# Patient Record
Sex: Female | Born: 1960 | Race: White | Hispanic: No | Marital: Married | Smoking: Never smoker
Health system: Southern US, Community
[De-identification: ages and names within clinical notes are randomized; demographics above are authoritative.]

## PROBLEM LIST (undated history)

## (undated) DIAGNOSIS — T7840XA Allergy, unspecified, initial encounter: Secondary | ICD-10-CM

## (undated) DIAGNOSIS — R011 Cardiac murmur, unspecified: Secondary | ICD-10-CM

## (undated) DIAGNOSIS — E739 Lactose intolerance, unspecified: Secondary | ICD-10-CM

## (undated) DIAGNOSIS — K219 Gastro-esophageal reflux disease without esophagitis: Secondary | ICD-10-CM

## (undated) DIAGNOSIS — K589 Irritable bowel syndrome without diarrhea: Secondary | ICD-10-CM

## (undated) DIAGNOSIS — E538 Deficiency of other specified B group vitamins: Secondary | ICD-10-CM

## (undated) DIAGNOSIS — E88819 Insulin resistance, unspecified: Secondary | ICD-10-CM

## (undated) DIAGNOSIS — E669 Obesity, unspecified: Secondary | ICD-10-CM

## (undated) DIAGNOSIS — I493 Ventricular premature depolarization: Secondary | ICD-10-CM

## (undated) DIAGNOSIS — H269 Unspecified cataract: Secondary | ICD-10-CM

## (undated) DIAGNOSIS — Z5189 Encounter for other specified aftercare: Secondary | ICD-10-CM

## (undated) DIAGNOSIS — M1990 Unspecified osteoarthritis, unspecified site: Secondary | ICD-10-CM

## (undated) DIAGNOSIS — E119 Type 2 diabetes mellitus without complications: Secondary | ICD-10-CM

## (undated) DIAGNOSIS — I10 Essential (primary) hypertension: Secondary | ICD-10-CM

## (undated) DIAGNOSIS — M549 Dorsalgia, unspecified: Secondary | ICD-10-CM

## (undated) DIAGNOSIS — R0602 Shortness of breath: Secondary | ICD-10-CM

## (undated) DIAGNOSIS — D689 Coagulation defect, unspecified: Secondary | ICD-10-CM

## (undated) HISTORY — DX: Shortness of breath: R06.02

## (undated) HISTORY — DX: Type 2 diabetes mellitus without complications: E11.9

## (undated) HISTORY — DX: Coagulation defect, unspecified: D68.9

## (undated) HISTORY — DX: Deficiency of other specified B group vitamins: E53.8

## (undated) HISTORY — DX: Allergy, unspecified, initial encounter: T78.40XA

## (undated) HISTORY — DX: Insulin resistance, unspecified: E88.819

## (undated) HISTORY — DX: Ventricular premature depolarization: I49.3

## (undated) HISTORY — DX: Unspecified cataract: H26.9

## (undated) HISTORY — DX: Gastro-esophageal reflux disease without esophagitis: K21.9

## (undated) HISTORY — DX: Irritable bowel syndrome, unspecified: K58.9

## (undated) HISTORY — DX: Dorsalgia, unspecified: M54.9

## (undated) HISTORY — DX: Cardiac murmur, unspecified: R01.1

## (undated) HISTORY — DX: Encounter for other specified aftercare: Z51.89

## (undated) HISTORY — DX: Unspecified osteoarthritis, unspecified site: M19.90

## (undated) HISTORY — PX: TONSILECTOMY/ADENOIDECTOMY WITH MYRINGOTOMY: SHX6125

## (undated) HISTORY — DX: Lactose intolerance, unspecified: E73.9

## (undated) HISTORY — DX: Obesity, unspecified: E66.9

---
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
## 1980-09-11 HISTORY — PX: APPENDECTOMY: SHX54

## 2008-09-11 DIAGNOSIS — I10 Essential (primary) hypertension: Secondary | ICD-10-CM | POA: Insufficient documentation

## 2008-09-11 DIAGNOSIS — D5919 Other autoimmune hemolytic anemia: Secondary | ICD-10-CM

## 2008-09-11 DIAGNOSIS — Z8639 Personal history of other endocrine, nutritional and metabolic disease: Secondary | ICD-10-CM | POA: Insufficient documentation

## 2008-09-11 HISTORY — DX: Other autoimmune hemolytic anemia: D59.19

## 2008-09-11 HISTORY — DX: Essential (primary) hypertension: I10

## 2010-09-11 HISTORY — PX: OTHER SURGICAL HISTORY: SHX169

## 2010-09-11 HISTORY — PX: TOTAL KNEE ARTHROPLASTY: SHX125

## 2012-05-12 DIAGNOSIS — K589 Irritable bowel syndrome without diarrhea: Secondary | ICD-10-CM

## 2012-05-12 HISTORY — PX: SPLENECTOMY: SUR1306

## 2012-05-12 HISTORY — DX: Irritable bowel syndrome, unspecified: K58.9

## 2017-06-26 DIAGNOSIS — Z9081 Acquired absence of spleen: Secondary | ICD-10-CM | POA: Insufficient documentation

## 2018-04-22 DIAGNOSIS — Z8639 Personal history of other endocrine, nutritional and metabolic disease: Secondary | ICD-10-CM | POA: Insufficient documentation

## 2019-03-25 ENCOUNTER — Encounter: Payer: Self-pay | Admitting: Physician Assistant

## 2019-03-25 ENCOUNTER — Ambulatory Visit (INDEPENDENT_AMBULATORY_CARE_PROVIDER_SITE_OTHER): Payer: Medicare Other | Admitting: Physician Assistant

## 2019-03-25 VITALS — BP 138/84 | Temp 97.3°F | Ht 69.0 in | Wt 299.0 lb

## 2019-03-25 DIAGNOSIS — K589 Irritable bowel syndrome without diarrhea: Secondary | ICD-10-CM

## 2019-03-25 DIAGNOSIS — I10 Essential (primary) hypertension: Secondary | ICD-10-CM | POA: Diagnosis not present

## 2019-03-25 DIAGNOSIS — D591 Other autoimmune hemolytic anemias: Secondary | ICD-10-CM | POA: Diagnosis not present

## 2019-03-25 DIAGNOSIS — D5919 Other autoimmune hemolytic anemia: Secondary | ICD-10-CM

## 2019-03-25 NOTE — Progress Notes (Signed)
Virtual Visit via Video   I connected with Connie Mosley on 03/25/19 at  8:20 AM EDT by a video enabled telemedicine application and verified that I am speaking with the correct person using two identifiers. Location patient: Home Location provider: [facility], Office Persons participating in the virtual visit: Connie Mosley, Jarold MottoSamantha Rula [place] PA-C   I discussed the limitations of evaluation and management by telemedicine and the availability of in person appointments. The patient expressed understanding and agreed to proceed.  Subjective:   HPI:   Idiopathic autoimmune hemolytic anemia -- diagnosed around 10 years ago. Hgb has been as low as in the 4's. Multiple transfusions. Treatment in past include: prednisone, chemo, splenectomy  HTN -- Currently taking Lisinopril 20 mg. At home blood pressure readings are: 130/78. Patient denies chest pain, SOB, blurred vision, dizziness, unusual headaches, lower leg swelling. Patient is compliant with medication. Denies excessive caffeine intake, stimulant usage, excessive alcohol intake, or increase in salt consumption.  IBS -- variable control; takes pepcid 40 mg daily, denies any urgent concerns at this time  ROS: See pertinent positives and negatives per HPI.  Patient Active Problem List   Diagnosis Date Noted  . History of non anemic vitamin B12 deficiency 04/22/2018  . Morbid obesity due to excess calories (HCC) 06/26/2017  . 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 Irritable bowel syndrome 05/12/2012  . Hypertension 09/11/2008  . Idiopathic autoimmune hemolytic anemia (HCC) 2010  . History of diet-controlled diabetes 2010    Social History   Tobacco Use  . Smoking status: Never Smoker  . Smokeless tobacco: Never Used  Substance Use Topics  . Alcohol use: Yes    Current Outpatient Medications:  .  cyanocobalamin (,VITAMIN B-12,) 1000 MCG/ML injection, Inject into the muscle., Disp: , Rfl:  .  famotidine (PEPCID) 40 MG tablet, Take 40  mg by mouth daily., Disp: , Rfl:  .  folic acid (FOLVITE) 1 MG tablet, Take by mouth., Disp: , Rfl:  .  lisinopril (ZESTRIL) 20 MG tablet, Take by mouth., Disp: , Rfl:   Allergies  Allergen Reactions  . Cefuroxime Axetil Itching  . Sulfamethoxazole-Trimethoprim Other (See Comments)    Reacts with Bone Marrow per Hemotologist   . Cefuroxime Hives and Rash    Objective:   VITALS: Per patient if applicable, see vitals. GENERAL: Alert, appears well and in no acute distress. HEENT: Atraumatic, conjunctiva clear, no obvious abnormalities on inspection of external nose and ears. NECK: Normal movements of the head and neck. CARDIOPULMONARY: No increased WOB. Speaking in clear sentences. I:E ratio WNL.  MS: Moves all visible extremities without noticeable abnormality. PSYCH: Pleasant and cooperative, well-groomed. Speech normal rate and rhythm. Affect is appropriate. Insight and judgement are appropriate. Attention is focused, linear, and appropriate.  NEURO: CN grossly intact. Oriented as arrived to appointment on time with no prompting. Moves both UE equally.  SKIN: No obvious lesions, wounds, erythema, or cyanosis noted on face or hands.  Assessment and Plan:   Taiylor was seen today for establish care.  Diagnoses and all orders for this visit:  Hypertension, unspecified type Controlled. Continue Lisinopril 20 mg daily. Follow-up in 1 year, sooner if issues.  Irritable bowel syndrome, unspecified type Controlled overall.  Idiopathic autoimmune hemolytic anemia (HCC) Management per Dr. Donia Guilesyan [place]. UTD on all vaxx.   . Reviewed expectations re: course of current medical issues. . Discussed self-management of symptoms. . Outlined signs and symptoms indicating need for more acute intervention. . Patient verbalized understanding and all questions  were answered. Marland Kitchen Health Maintenance issues including appropriate healthy diet, exercise, and smoking avoidance were discussed with patient.  . See orders for this visit as documented in the electronic medical record.  I discussed the assessment and treatment plan with the patient. The patient was provided an opportunity to ask questions and all were answered. The patient agreed with the plan and demonstrated an understanding of the instructions.   The patient was advised to call back or seek an in-person evaluation if the symptoms worsen or if the condition fails to improve as anticipated.    [place], [place] 03/25/2019

## 2019-03-25 NOTE — Patient Instructions (Addendum)
Health Maintenance Due  Topic Date Due  . Hepatitis C Screening  11-12-1960  . URINE MICROALBUMIN  10/11/1968  . HIV Screening  10/11/1973  . TETANUS/TDAP  10/11/1977  . PAP SMEAR-Modifier 2018 10/12/1979  . MAMMOGRAM 2018 10/11/2008  . COLONOSCOPY 2014 10/11/2008  Will discuss during visit.  Depression screen [facility] 2/9 03/25/2019  Decreased Interest 0  Down, Depressed, Hopeless 0  PHQ - 2 Score 0

## 2019-05-12 ENCOUNTER — Other Ambulatory Visit: Payer: Self-pay | Admitting: Physician Assistant

## 2019-05-12 MED ORDER — FAMOTIDINE 40 MG PO TABS: 40.0000 mg | ORAL_TABLET | Freq: Every day | ORAL | 1 refills | Status: DC

## 2019-05-12 NOTE — Telephone Encounter (Signed)
Medication Refill - Medication: famotidine (PEPCID) 40 MG tablet  Has the patient contacted their pharmacy? Yes - this will be the first time that PCP has filled. (Agent: If no, request that the patient contact the pharmacy for the refill.) (Agent: If yes, when and what did the pharmacy advise?)  Preferred Pharmacy (with phone number or street name):  [id number] - [place], [place] - [place] [phone] (Phone) [phone] (Fax)   Agent: Please be advised that RX refills may take up to 3 business days. We ask that you follow-up with your pharmacy.

## 2019-05-12 NOTE — Telephone Encounter (Signed)
See note

## 2019-05-12 NOTE — Telephone Encounter (Signed)
Requested medication (s) are due for refill today: yes  Requested medication (s) are on the active medication list: yes Last refill:  03/2019  Future visit scheduled: no  Notes to clinic: review for refill   Requested Prescriptions  Pending Prescriptions Disp Refills   famotidine (PEPCID) 40 MG tablet       Sig: Take 1 tablet (40 mg total) by mouth daily.     Gastroenterology:  H2 Antagonists Passed - 05/12/2019 11:07 AM      Passed - Valid encounter within last 12 months    Recent Outpatient Visits          1 month ago Hypertension, unspecified type   Leland Riggston, [place], [place]

## 2019-07-17 ENCOUNTER — Other Ambulatory Visit: Payer: Self-pay | Admitting: Physician Assistant

## 2019-07-17 MED ORDER — LISINOPRIL 20 MG PO TABS: 20.0000 mg | ORAL_TABLET | Freq: Every day | ORAL | 1 refills | Status: DC

## 2019-07-17 NOTE — Telephone Encounter (Signed)
rx refill lisinopril (ZESTRIL) 20 MG tablet  PHARMACY [id number] - [place], [place] - [place]

## 2019-07-17 NOTE — Telephone Encounter (Signed)
Rx sent to pharmacy   

## 2019-07-17 NOTE — Telephone Encounter (Signed)
See request °

## 2019-10-31 ENCOUNTER — Other Ambulatory Visit: Payer: Self-pay | Admitting: Physician Assistant

## 2019-12-22 ENCOUNTER — Telehealth: Payer: Self-pay | Admitting: Physician Assistant

## 2019-12-22 NOTE — Telephone Encounter (Signed)
I spoke with the patient and scheduled her AWV with Toni Amend and CPE with Lelon Mast.  Could someone please contact the patient with information on how and when to complete labs if needed?  Thanks.

## 2019-12-23 NOTE — Telephone Encounter (Signed)
Left detailed message on personal voicemail when you come in for your physical on April 26th with Lelon Mast you will have labs that day. Nothing to eat after midnight may have water, black coffee and unsweet tea. Any questions please call the office.

## 2020-01-05 ENCOUNTER — Ambulatory Visit (INDEPENDENT_AMBULATORY_CARE_PROVIDER_SITE_OTHER): Payer: Medicare Other | Admitting: Physician Assistant

## 2020-01-05 ENCOUNTER — Encounter: Payer: Self-pay | Admitting: Physician Assistant

## 2020-01-05 ENCOUNTER — Other Ambulatory Visit: Payer: Self-pay

## 2020-01-05 ENCOUNTER — Ambulatory Visit (INDEPENDENT_AMBULATORY_CARE_PROVIDER_SITE_OTHER): Payer: Medicare Other

## 2020-01-05 VITALS — BP 128/76 | HR 64 | Temp 98.8°F | Ht 69.0 in | Wt 323.0 lb

## 2020-01-05 DIAGNOSIS — Z13220 Encounter for screening for lipoid disorders: Secondary | ICD-10-CM

## 2020-01-05 DIAGNOSIS — I493 Ventricular premature depolarization: Secondary | ICD-10-CM | POA: Diagnosis not present

## 2020-01-05 DIAGNOSIS — E8881 Metabolic syndrome: Secondary | ICD-10-CM | POA: Diagnosis not present

## 2020-01-05 DIAGNOSIS — Z136 Encounter for screening for cardiovascular disorders: Secondary | ICD-10-CM | POA: Diagnosis not present

## 2020-01-05 DIAGNOSIS — K589 Irritable bowel syndrome without diarrhea: Secondary | ICD-10-CM

## 2020-01-05 DIAGNOSIS — Z0000 Encounter for general adult medical examination without abnormal findings: Secondary | ICD-10-CM

## 2020-01-05 DIAGNOSIS — R079 Chest pain, unspecified: Secondary | ICD-10-CM

## 2020-01-05 LAB — LIPID PANEL
Cholesterol: 172 mg/dL (ref 0–200)
HDL: 51.6 mg/dL (ref >39.00)
LDL Cholesterol: 102 mg/dL — ABNORMAL HIGH (ref 0–99)
NonHDL: 120.81
Total CHOL/HDL Ratio: 3
Triglycerides: 94 mg/dL (ref 0.0–149.0)
VLDL: 18.8 mg/dL (ref 0.0–40.0)

## 2020-01-05 LAB — HEMOGLOBIN A1C: Hgb A1c MFr Bld: 6.1 % (ref 4.6–6.5)

## 2020-01-05 LAB — CBC WITH DIFFERENTIAL/PLATELET
Basophils Absolute: 0.1 10*3/uL (ref 0.0–0.1)
Basophils Relative: 1.3 % (ref 0.0–3.0)
Eosinophils Absolute: 0.3 10*3/uL (ref 0.0–0.7)
Eosinophils Relative: 3.1 % (ref 0.0–5.0)
HCT: 45.6 % (ref 36.0–46.0)
Hemoglobin: 15 g/dL (ref 12.0–15.0)
Lymphocytes Relative: 51.2 % — ABNORMAL HIGH (ref 12.0–46.0)
Lymphs Abs: 4.9 10*3/uL — ABNORMAL HIGH (ref 0.7–4.0)
MCHC: 32.9 g/dL (ref 30.0–36.0)
MCV: 88.3 fl (ref 78.0–100.0)
Monocytes Absolute: 0.8 10*3/uL (ref 0.1–1.0)
Monocytes Relative: 8.1 % (ref 3.0–12.0)
Neutro Abs: 3.5 10*3/uL (ref 1.4–7.7)
Neutrophils Relative %: 36.3 % — ABNORMAL LOW (ref 43.0–77.0)
Platelets: 258 10*3/uL (ref 150.0–400.0)
RBC: 5.16 Mil/uL — ABNORMAL HIGH (ref 3.87–5.11)
RDW: 16 % — ABNORMAL HIGH (ref 11.5–15.5)
WBC: 9.6 10*3/uL (ref 4.0–10.5)

## 2020-01-05 LAB — COMPREHENSIVE METABOLIC PANEL
ALT: 26 U/L (ref 0–35)
AST: 27 U/L (ref 0–37)
Albumin: 4.4 g/dL (ref 3.5–5.2)
Alkaline Phosphatase: 67 U/L (ref 39–117)
BUN: 9 mg/dL (ref 6–23)
CO2: 28 mEq/L (ref 19–32)
Calcium: 9.6 mg/dL (ref 8.4–10.5)
Chloride: 104 mEq/L (ref 96–112)
Creatinine, Ser: 0.79 mg/dL (ref 0.40–1.20)
GFR: 73.93 mL/min (ref >60.00)
Glucose, Bld: 128 mg/dL — ABNORMAL HIGH (ref 70–99)
Potassium: 5 mEq/L (ref 3.5–5.1)
Sodium: 140 mEq/L (ref 135–145)
Total Bilirubin: 1.1 mg/dL (ref 0.2–1.2)
Total Protein: 6.9 g/dL (ref 6.0–8.3)

## 2020-01-05 LAB — TSH: TSH: 0.85 u[IU]/mL (ref 0.35–4.50)

## 2020-01-05 NOTE — Patient Instructions (Addendum)
It was great to see you!  I will be in touch via Mychart regarding your lab results. If you develop any severe chest pain or shortness of breath, please go to the ER.  Cardiology will contact you for an appointment.  To-do list: 1. Cardiology 2. Mammogram 3. Get [place] your colonoscopy records  Due to recent changes in healthcare laws, you may see the results of your imaging and laboratory studies on MyChart before your provider has had a chance to review them.  We understand that in some cases there may be results that are confusing or concerning to you. Not all laboratory results come back in the same time frame and the provider may be waiting for multiple results in order to interpret others.  Please give [place] 48 hours in order for your provider to thoroughly review all the results before contacting the office for clarification of your results.   Take care,  Jarold Motto PA-C

## 2020-01-05 NOTE — Patient Instructions (Signed)
Connie Mosley , Thank you for taking time to come for your Medicare Wellness Visit. I appreciate your ongoing commitment to your health goals. Please review the following plan we discussed and let me know if I can assist you in the future.   Screening recommendations/referrals: Colorectal Screening:  please provide records if available  Mammogram: please consider  Bone Density: please provide records if available   Vision and Dental Exams: Recommended annual ophthalmology exams for early detection of glaucoma and other disorders of the eye Recommended annual dental exams for proper oral hygiene  Vaccinations: Influenza vaccine: completed  Pneumococcal vaccine: up to date  Tdap vaccine: up to date   Shingles vaccine:  You may receive this vaccine at your local pharmacy. (see handout)  Covid vaccine:  Completed   Advanced directives:  I have provided a copy for you to complete at home and have notarized. Once this is complete please bring a copy in to our office so we can scan it into your chart.  Goals: Recommend to drink at least 6-8 8oz glasses of water per day and consume a balanced diet rich in fresh fruits and vegetables.   Next appointment: Please schedule your Annual Wellness Visit with your Nurse Health Advisor in one year.  Preventive Care 40-64 Years, Female Preventive care refers to lifestyle choices and visits with your health care provider that can promote health and wellness. What does preventive care include?  A yearly physical exam. This is also called an annual well check.  Dental exams once or twice a year.  Routine eye exams. Ask your health care provider how often you should have your eyes checked.  Personal lifestyle choices, including:  Daily care of your teeth and gums.  Regular physical activity.  Eating a healthy diet.  Avoiding tobacco and drug use.  Limiting alcohol use.  Practicing safe sex.  Taking low-dose aspirin daily starting at age 87 if  recommended by your health care provider.  Taking vitamin and mineral supplements as recommended by your health care provider. What happens during an annual well check? The services and screenings done by your health care provider during your annual well check will depend on your age, overall health, lifestyle risk factors, and family history of disease. Counseling  Your health care provider may ask you questions about your:  Alcohol use.  Tobacco use.  Drug use.  Emotional well-being.  Home and relationship well-being.  Sexual activity.  Eating habits.  Work and work Statistician.  Method of birth control.  Menstrual cycle.  Pregnancy history. Screening  You may have the following tests or measurements:  Height, weight, and BMI.  Blood pressure.  Lipid and cholesterol levels. These may be checked every 5 years, or more frequently if you are over 71 years old.  Skin check.  Lung cancer screening. You may have this screening every year starting at age 60 if you have a 30-pack-year history of smoking and currently smoke or have quit within the past 15 years.  Fecal occult blood test (FOBT) of the stool. You may have this test every year starting at age 51.  Flexible sigmoidoscopy or colonoscopy. You may have a sigmoidoscopy every 5 years or a colonoscopy every 10 years starting at age 13.  Hepatitis C blood test.  Hepatitis B blood test.  Sexually transmitted disease (STD) testing.  Diabetes screening. This is done by checking your blood sugar (glucose) after you have not eaten for a while (fasting). You may have this done  every 1-3 years.  Mammogram. This may be done every 1-2 years. Talk to your health care provider about when you should start having regular mammograms. This may depend on whether you have a family history of breast cancer.  BRCA-related cancer screening. This may be done if you have a family history of breast, ovarian, tubal, or peritoneal  cancers.  Pelvic exam and Pap test. This may be done every 3 years starting at age 17. Starting at age 53, this may be done every 5 years if you have a Pap test in combination with an HPV test.  Bone density scan. This is done to screen for osteoporosis. You may have this scan if you are at high risk for osteoporosis. Discuss your test results, treatment options, and if necessary, the need for more tests with your health care provider. Vaccines  Your health care provider may recommend certain vaccines, such as:  Influenza vaccine. This is recommended every year.  Tetanus, diphtheria, and acellular pertussis (Tdap, Td) vaccine. You may need a Td booster every 10 years.  Zoster vaccine. You may need this after age 62.  Pneumococcal 13-valent conjugate (PCV13) vaccine. You may need this if you have certain conditions and were not previously vaccinated.  Pneumococcal polysaccharide (PPSV23) vaccine. You may need one or two doses if you smoke cigarettes or if you have certain conditions. Talk to your health care provider about which screenings and vaccines you need and how often you need them. This information is not intended to replace advice given to you by your health care provider. Make sure you discuss any questions you have with your health care provider. Document Released: 09/24/2015 Document Revised: 05/17/2016 Document Reviewed: 06/29/2015 Elsevier Interactive Patient Education  2017 [employer] Prevention in the Home Falls can cause injuries. They can happen to people of all ages. There are many things you can do to make your home safe and to help prevent falls. What can I do on the outside of my home?  Regularly fix the edges of walkways and driveways and fix any cracks.  Remove anything that might make you trip as you walk through a door, such as a raised step or threshold.  Trim any bushes or trees on the path to your home.  Use bright outdoor lighting.  Clear  any walking paths of anything that might make someone trip, such as rocks or tools.  Regularly check to see if handrails are loose or broken. Make sure that both sides of any steps have handrails.  Any raised decks and porches should have guardrails on the edges.  Have any leaves, snow, or ice cleared regularly.  Use sand or salt on walking paths during winter.  Clean up any spills in your garage right away. This includes oil or grease spills. What can I do in the bathroom?  Use night lights.  Install grab bars by the toilet and in the tub and shower. Do not use towel bars as grab bars.  Use non-skid mats or decals in the tub or shower.  If you need to sit down in the shower, use a plastic, non-slip stool.  Keep the floor dry. Clean up any water that spills on the floor as soon as it happens.  Remove soap buildup in the tub or shower regularly.  Attach bath mats securely with double-sided non-slip rug tape.  Do not have throw rugs and other things on the floor that can make you trip. What can I  do in the bedroom?  Use night lights.  Make sure that you have a light by your bed that is easy to reach.  Do not use any sheets or blankets that are too big for your bed. They should not hang down onto the floor.  Have a firm chair that has side arms. You can use this for support while you get dressed.  Do not have throw rugs and other things on the floor that can make you trip. What can I do in the kitchen?  Clean up any spills right away.  Avoid walking on wet floors.  Keep items that you use a lot in easy-to-reach places.  If you need to reach something above you, use a strong step stool that has a grab bar.  Keep electrical cords out of the way.  Do not use floor polish or wax that makes floors slippery. If you must use wax, use non-skid floor wax.  Do not have throw rugs and other things on the floor that can make you trip. What can I do with my stairs?  Do not  leave any items on the stairs.  Make sure that there are handrails on both sides of the stairs and use them. Fix handrails that are broken or loose. Make sure that handrails are as long as the stairways.  Check any carpeting to make sure that it is firmly attached to the stairs. Fix any carpet that is loose or worn.  Avoid having throw rugs at the top or bottom of the stairs. If you do have throw rugs, attach them to the floor with carpet tape.  Make sure that you have a light switch at the top of the stairs and the bottom of the stairs. If you do not have them, ask someone to add them for you. What else can I do to help prevent falls?  Wear shoes that:  Do not have high heels.  Have rubber bottoms.  Are comfortable and fit you well.  Are closed at the toe. Do not wear sandals.  If you use a stepladder:  Make sure that it is fully opened. Do not climb a closed stepladder.  Make sure that both sides of the stepladder are locked into place.  Ask someone to hold it for you, if possible.  Clearly mark and make sure that you can see:  Any grab bars or handrails.  First and last steps.  Where the edge of each step is.  Use tools that help you move around (mobility aids) if they are needed. These include:  Canes.  Walkers.  Scooters.  Crutches.  Turn on the lights when you go into a dark area. Replace any light bulbs as soon as they burn out.  Set up your furniture so you have a clear path. Avoid moving your furniture around.  If any of your floors are uneven, fix them.  If there are any pets around you, be aware of where they are.  Review your medicines with your doctor. Some medicines can make you feel dizzy. This can increase your chance of falling. Ask your doctor what other things that you can do to help prevent falls. This information is not intended to replace advice given to you by your health care provider. Make sure you discuss any questions you have with  your health care provider. Document Released: 06/24/2009 Document Revised: 02/03/2016 Document Reviewed: 10/02/2014 Elsevier Interactive Patient Education  2017 [employer].

## 2020-01-05 NOTE — Progress Notes (Deleted)
I acted as a Education administrator for [employer], PA-C Anselmo Pickler, LPN    Subjective:    Connie Mosley is a 59 y.o. female and is here for a comprehensive physical exam.   HPI  Health Maintenance Due  Topic Date Due  . Hepatitis C Screening  Never done  . HIV Screening  Never done  . COVID-19 Vaccine (1) Never done  . PAP SMEAR-Modifier  Never done  . MAMMOGRAM  Never done  . COLONOSCOPY  Never done    Acute Concerns:   Chronic Issues:   Health Maintenance: Immunizations -- UTD Colonoscopy --  Mammogram --  PAP --  Bone Density --  Diet -- *** Caffeine intake -- *** Sleep habits -- *** Exercise -- *** Weight --    Mood -- *** Weight history: Wt Readings from Last 10 Encounters:  03/25/19 299 lb (135.6 kg)   No LMP recorded. Period characteristics: Alcohol use: Tobacco use:  Depression screen PHQ 2/9 03/25/2019  Decreased Interest 0  Down, Depressed, Hopeless 0  PHQ - 2 Score 0     Other providers/specialists: Patient Care Team: Inda Coke, [place] as PCP - General (Physician Assistant) Hart Robinsons, MD as Referring Physician (Hematology and Oncology)    PMHx, SurgHx, SocialHx, Medications, and Allergies were reviewed in the Visit Navigator and updated as appropriate.   Past Medical History:  Diagnosis Date  . Hypertension 09/11/2008  . Idiopathic autoimmune hemolytic anemia ([place]) 2010   rx included in past prednisone, retuximab, splenectomy  . Irritable bowel syndrome 05/12/2012    No past surgical history on file.  No family history on file.  Social History   Tobacco Use  . Smoking status: Never Smoker  . Smokeless tobacco: Never Used  Substance Use Topics  . Alcohol use: Yes  . Drug use: Never    Review of Systems:   ROS  Objective:   There were no vitals taken for this visit. There is no height or weight on file to calculate BMI.   General Appearance:    Alert, cooperative, no distress, appears stated age  Head:     Normocephalic, without obvious abnormality, atraumatic  Eyes:    PERRL, conjunctiva/corneas clear, EOM's intact, fundi    benign, both eyes  Ears:    Normal TM's and external ear canals, both ears  Nose:   Nares normal, septum midline, mucosa normal, no drainage    or sinus tenderness  Throat:   Lips, mucosa, and tongue normal; teeth and gums normal  Neck:   Supple, symmetrical, trachea midline, no adenopathy;    thyroid:  no enlargement/tenderness/nodules; no carotid   bruit or JVD  Back:     Symmetric, no curvature, ROM normal, no CVA tenderness  Lungs:     Clear to auscultation bilaterally, respirations unlabored  Chest Wall:    No tenderness or deformity   Heart:    Regular rate and rhythm, S1 and S2 normal, no murmur, rub or gallop  Breast Exam:    No tenderness, masses, or nipple abnormality  Abdomen:     Soft, non-tender, bowel sounds active all four quadrants,    no masses, no organomegaly  Genitalia:    Normal female without lesion, discharge or tenderness  Extremities:   Extremities normal, atraumatic, no cyanosis or edema  Pulses:   2+ and symmetric all extremities  Skin:   Skin color, texture, turgor normal, no rashes or lesions  Lymph nodes:   Cervical, supraclavicular, and axillary nodes normal  Neurologic:   CNII-XII intact, normal strength, sensation and reflexes    throughout   No results found for this or any previous visit.  Assessment/Plan:   There are no diagnoses linked to this encounter.   Well Adult Exam: Labs ordered: {Yes/No:18319::"Yes"}. Patient counseling was done. See below for items discussed. Discussed the patient's BMI.  The {BMI plan (MU NQF measure 421):19504} Follow up {follow-up interval:13814}. Breast cancer screening: ***. Cervical cancer screening: ***   Patient Counseling: [x]    Nutrition: Stressed importance of moderation in sodium/caffeine intake, saturated fat and cholesterol, caloric balance, sufficient intake of fresh fruits, vegetables,  fiber, calcium, iron, and 1 mg of folate supplement per day (for females capable of pregnancy).  [x]    Stressed the importance of regular exercise.   [x]    Substance Abuse: Discussed cessation/primary prevention of tobacco, alcohol, or other drug use; driving or other dangerous activities under the influence; availability of treatment for abuse.   [x]    Injury prevention: Discussed safety belts, safety helmets, smoke detector, smoking near bedding or upholstery.   [x]    Sexuality: Discussed sexually transmitted diseases, partner selection, use of condoms, avoidance of unintended pregnancy  and contraceptive alternatives.  [x]    Dental health: Discussed importance of regular tooth brushing, flossing, and dental visits.  [x]    Health maintenance and immunizations reviewed. Please refer to Health maintenance section.   ***  , PA-C [place] [facility]

## 2020-01-05 NOTE — Progress Notes (Signed)
Connie Mosley is a 59 y.o. female here for a new problem.  I acted as a Neurosurgeon for [employer], PA-C Corky Mull, LPN   History of Present Illness:   Chief Complaint  Patient presents with  . Irritable Bowel Syndrome  . Bradycardia    HPI   Irritable bowel syndrome Pt has been having trouble with constipation/diarrhea, specifically painful burping, painful peristalsis, even has pain after going to the bathroom, for 10-15 minutes afterwards.  Pt decided to try to go Gluten free two weeks ago and has noticed a difference in her bowels. Has had two colonoscopies that are normal -- I do not have these records. She denies rectal bleeding or unintentional weight loss.  Bradycardia Pt c/o slow heart rate past several months. Pulse is ranging 58-64. She is c/o some SOB with activity (such as vacuuming, walking briskly) and chest discomfort (mid chest tightness.) All symptoms improve with rest. Taking lisinopril 20 mg without any concerns. Experiencing this about once every 4-6 weeks.  Pre-diabetes Highest HgbA1c was 6.1. Was on metformin at one time. Needs updated HgbA1c. Denies polydipsia/polyuria.    Past Medical History:  Diagnosis Date  . Hypertension 09/11/2008  . Idiopathic autoimmune hemolytic anemia 2010   rx included in past prednisone, retuximab, splenectomy  . Irritable bowel syndrome 05/12/2012     Social History   Socioeconomic History  . Marital status: Married    Spouse name: Not on file  . Number of children: Not on file  . Years of education: Not on file  . Highest education level: Not on file  Occupational History  . Occupation: Retired   Tobacco Use  . Smoking status: Never Smoker  . Smokeless tobacco: Never Used  Substance and Sexual Activity  . Alcohol use: Yes    Comment: wine 2 x a week  . Drug use: Never  . Sexual activity: Not on file  Other Topics Concern  . 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 Difficulty of Paying Living Expenses:   Food Insecurity:   . Worried About Programme researcher, broadcasting/film/video in the Last Year:   . Barista in the Last Year:   Transportation Needs:   . Freight forwarder (Medical):   Marland Kitchen Lack of Transportation (Non-Medical):   Physical Activity:   . Days of Exercise per Week:   . Minutes of Exercise per Session:   Stress:   . Feeling of Stress :   Social Connections:   . Frequency of Communication with Friends and Family:   . Frequency of Social Gatherings with Friends and Family:   . Attends Religious Services:   . Active Member of Clubs or Organizations:   . Attends Banker Meetings:   Marland Kitchen Marital Status:   Intimate Partner Violence:   . Fear of Current or Ex-Partner:   . Emotionally Abused:   Marland Kitchen Physically Abused:   . Sexually Abused:     Past Surgical History:  Procedure Laterality Date  . APPENDECTOMY  1982  . 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 TONSILECTOMY/ADENOIDECTOMY WITH MYRINGOTOMY     childhood   . total knee Left 2012    Family History  Adopted: Yes    Allergies  Allergen Reactions  .  Cefuroxime Axetil Itching  . Sulfamethoxazole-Trimethoprim Other (See Comments)    Reacts with Bone Marrow per Hemotologist   . Cefuroxime Hives and Rash    Current Medications:   Current Outpatient Medications:  .  cyanocobalamin (,VITAMIN B-12,) 1000 MCG/ML injection, Inject into the muscle., Disp: , Rfl:  .  famotidine (PEPCID) 40 MG tablet, TAKE 1 TABLET BY MOUTH EVERY DAY, Disp: 90 tablet, Rfl: 1 .  folic acid (FOLVITE) 1 MG tablet, Take by mouth., Disp: , Rfl:  .  lisinopril (ZESTRIL) 20 MG tablet, Take 1 tablet (20 mg total) by mouth daily., Disp: 90 tablet, Rfl: 1   Review of Systems:   ROS  Negative unless otherwise specified per HPI.  Vitals:   Vitals:   01/05/20 0917  BP:  128/76  Pulse: 64  Temp: 98.8 F (37.1 C)  TempSrc: Temporal  SpO2: 96%  Weight: (!) 323 lb (146.5 kg)  Height: 5\' 9"  (1.753 m)     Body mass index is 47.7 kg/m.  Physical Exam:   Physical Exam Vitals and nursing note reviewed.  Constitutional:      General: She is not in acute distress.    Appearance: She is well-developed. She is not ill-appearing or toxic-appearing.  Cardiovascular:     Rate and Rhythm: Normal rate and regular rhythm.     Pulses: Normal pulses.     Heart sounds: Normal heart sounds, S1 normal and S2 normal.     Comments: No LE edema Pulmonary:     Effort: Pulmonary effort is normal.     Breath sounds: Normal breath sounds.  Skin:    General: Skin is warm and dry.  Neurological:     Mental Status: She is alert.     GCS: GCS eye subscore is 4. GCS verbal subscore is 5. GCS motor subscore is 6.  Psychiatric:        Speech: Speech normal.        Behavior: Behavior normal. Behavior is cooperative.     No results found for this or any previous visit.  Assessment and Plan:   Kellyjo was seen today for irritable bowel syndrome and bradycardia.  Diagnoses and all orders for this visit:  Symptomatic PVCs; Chest pain, unspecified type EKG tracing is personally reviewed.  EKG notes occasional PVCs.  No acute changes. Will refer to cardiology. Recommended avoiding any substantial cardiac activity until she is seen by cardiology.  Also gave her ER precautions if she has severe shortness of breath or chest pain.  Patient verbalized understanding is agreeable to plan.  We will also update labs. -     EKG 12-Lead -     CBC with Differential/Platelet -     Comprehensive metabolic panel -     TSH -     Ambulatory referral to Cardiology  Insulin resistance  Update hemoglobin A1c and provide recommendations based upon lab results. -     Hemoglobin A1c  Encounter for lipid screening for cardiovascular disease -     Lipid panel  Irritable bowel syndrome,  unspecified type Improved with gluten-free diet.  She is going to get her colonoscopy records.  I would recommend that she continue a gluten modify diet if this is helping her symptoms substantially.  No red flags on discussion, follow-up if worsening symptoms.   . Reviewed expectations re: course of current medical issues. . Discussed self-management of symptoms. . Outlined signs and symptoms indicating need for more acute intervention. . Patient verbalized understanding and all  questions were answered. . See orders for this visit as documented in the electronic medical record. . Patient received an After-Visit Summary.  CMA or LPN served as scribe during this visit. History, Physical, and Plan performed by medical provider. The above documentation has been reviewed and is accurate and complete.  Inda Coke, PA-C

## 2020-01-05 NOTE — Progress Notes (Addendum)
Subjective:   Connie Mosley is a 59 y.o. female who presents for an Initial Medicare Annual Wellness Visit.  Review of Systems     Cardiac Risk Factors include: hypertension;obesity (BMI >30kg/m2)    Objective:    Today's Vitals   01/05/20 0911  BP: 128/76  Pulse: 64  Temp: 98.8 F (37.1 C)  TempSrc: Temporal  SpO2: 96%  Weight: (!) 323 lb (146.5 kg)  Height: 5\' 9"  (1.753 m)   Body mass index is 47.7 kg/m.  Advanced Directives 01/05/2020  Does Patient Have a Medical Advance Directive? No  Would patient like information on creating a medical advance directive? Yes (MAU/Ambulatory/Procedural Areas - Information given)    Current Medications (verified) Outpatient Encounter Medications as of 01/05/2020  Medication Sig  . cyanocobalamin (,VITAMIN B-12,) 1000 MCG/ML injection Inject into the muscle.  . famotidine (PEPCID) 40 MG tablet TAKE 1 TABLET BY MOUTH EVERY DAY  . folic acid (FOLVITE) 1 MG tablet Take by mouth.  Marland Kitchen lisinopril (ZESTRIL) 20 MG tablet Take 1 tablet (20 mg total) by mouth daily.   No facility-administered encounter medications on file as of 01/05/2020.    Allergies (verified) Cefuroxime axetil, Sulfamethoxazole-trimethoprim, and Cefuroxime   History: Past Medical History:  Diagnosis Date  . Hypertension 09/11/2008  . Idiopathic autoimmune hemolytic anemia 2010   rx included in past prednisone, retuximab, splenectomy  . Irritable bowel syndrome 05/12/2012   Past Surgical History:  Procedure Laterality Date  . APPENDECTOMY  1982  . 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 TONSILECTOMY/ADENOIDECTOMY WITH MYRINGOTOMY     childhood   . total knee Left 2012   No family history on file. Social History   Socioeconomic History  . Marital status: Married    Spouse name: Not on file  . Number of children: Not on file  . Years of education: Not on file  . Highest education level: Not on file  Occupational History  . Occupation: Retired   Tobacco Use  . Smoking  status: Never Smoker  . Smokeless tobacco: Never Used  Substance and Sexual Activity  . Alcohol use: Yes  . Drug use: Never  . Sexual activity: Not on file  Other Topics Concern  . 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 Difficulty of Paying Living Expenses:   Food Insecurity:   . Worried About Charity fundraiser in the Last Year:   . Arboriculturist in the Last Year:   Transportation Needs:   . Film/video editor (Medical):   Marland Kitchen Lack of Transportation (Non-Medical):   Physical Activity:   . Days of Exercise per Week:   . Minutes of Exercise per Session:   Stress:   . Feeling of Stress :   Social Connections:   . Frequency of Communication with Friends and Family:   . Frequency of Social Gatherings with Friends and Family:   . Attends Religious Services:   . Active Member of Clubs or Organizations:   . Attends Archivist Meetings:   Marland Kitchen Marital Status:     Tobacco Counseling Counseling given: Not Answered   Clinical Intake:  Pre-visit preparation completed: Yes  Pain : No/denies pain  Diabetes: No  How often do you need to have  someone help you when you read instructions, pamphlets, or other written materials from your doctor or pharmacy?: 1 - Never  Interpreter Needed?: No  Information entered by :: Kandis Fantasia LPN   Activities of Daily Living In your present state of health, do you have any difficulty performing the following activities: 01/05/2020  Hearing? N  Vision? N  Difficulty concentrating or making decisions? N  Walking or climbing stairs? N  Dressing or bathing? N  Doing errands, shopping? N  Preparing Food and eating ? N  Using the Toilet? N  In the past six months, have you accidently leaked urine? N  Do you have problems with loss of bowel  control? N  Managing your Medications? N  Managing your Finances? N  Housekeeping or managing your Housekeeping? N     Immunizations and Health Maintenance Immunization History  Administered Date(s) Administered  . Influenza,inj,Quad PF,6+ Mos 06/25/2017, 06/25/2018, 06/17/2019  . Meningococcal B Recombinant 10/17/2016  . Meningococcal Conjugate 10/17/2016, 12/22/2016  . Moderna SARS-COVID-2 Vaccination 11/28/2019, 12/27/2019  . Pneumococcal Conjugate-13 10/17/2016  . Pneumococcal Polysaccharide-23 12/21/2016  . Tdap 02/29/2016   Health Maintenance Due  Topic Date Due  . Hepatitis C Screening  Never done  . HIV Screening  Never done  . PAP SMEAR-Modifier  Never done  . MAMMOGRAM  Never done  . COLONOSCOPY  Never done    Patient Care Team: Jarold Motto, [place] as PCP - General (Physician Assistant) Donia Guiles, MD as Referring Physician (Hematology and Oncology)  Indicate any recent Medical Services you may have received from other than [facility] providers in the past year (date may be approximate).     Assessment:   This is a routine wellness examination for Connie Mosley.  Hearing/Vision screen No exam data present  Dietary issues and exercise activities discussed: Current Exercise Habits: The patient does not participate in regular exercise at present  Goals   None    Depression Screen PHQ 2/9 Scores 01/05/2020 03/25/2019  PHQ - 2 Score 0 0    Fall Risk Fall Risk  01/05/2020  Falls in the past year? 0  Number falls in past yr: 0  Injury with Fall? 0  Follow up Falls evaluation completed;Education provided;Falls prevention discussed    Is the patient's home free of loose throw rugs in walkways, pet beds, electrical cords, etc?   yes      Grab bars in the bathroom? yes      Handrails on the stairs?   yes      Adequate lighting?   yes  Timed Get Up and Go Performed completed and within normal timeframe; no gait abnormalities noted   Cognitive Function: no cognitive  concerns at this time    6CIT Screen 01/05/2020  What Year? 0 points  What month? 0 points  What time? 0 points  Count back from 20 0 points  Months in reverse 0 points  Repeat phrase 0 points  Total Score 0    Screening Tests Health Maintenance  Topic Date Due  . Hepatitis C Screening  Never done  . HIV Screening  Never done  . PAP SMEAR-Modifier  Never done  . MAMMOGRAM  Never done  . COLONOSCOPY  Never done  . INFLUENZA VACCINE  04/11/2020  . TETANUS/TDAP  02/28/2026  . COVID-19 Vaccine  Completed    Qualifies for Shingles Vaccine? Discussed and patient will check with pharmacy for coverage.  Patient education handout provided    Cancer Screenings: Lung: Low Dose CT Chest  recommended if Age 38-80 years, 30 pack-year currently smoking OR have quit w/in 15years. Patient does not qualify. Breast: Up to date on Mammogram? No; patient will consider  Up to date of Bone Density/Dexa? Yes Colorectal: will update records once received from patient    Plan:  I have personally reviewed and addressed the Medicare Annual Wellness questionnaire and have noted the following in the patient's chart:  A. Medical and social history B. Use of alcohol, tobacco or illicit drugs  C. Current medications and supplements D. Functional ability and status E.  Nutritional status F.  Physical activity G. Advance directives H. List of other physicians I.  Hospitalizations, surgeries, and ER visits in previous 12 months J.  Vitals K. Screenings such as hearing and vision if needed, cognitive and depression L. Referrals, records requested, and appointments- none   In addition, I have reviewed and discussed with patient certain preventive protocols, quality metrics, and best practice recommendations. A written personalized care plan for preventive services as well as general preventive health recommendations were provided to patient.   Signed,  Kandis Fantasia, LPN  Nurse Health  Advisor   Nurse Notes: Patient will upload records of last colonoscopy and dexa from records she has from [place]    I have reviewed documentation for AWV and Advance Care planning provided by [facility], I agree with documentation, I was immediately available for any questions. Jarold Motto, [place]

## 2020-01-07 ENCOUNTER — Encounter: Payer: Self-pay | Admitting: Physician Assistant

## 2020-01-09 ENCOUNTER — Other Ambulatory Visit: Payer: Self-pay

## 2020-01-09 ENCOUNTER — Encounter: Payer: Self-pay | Admitting: Internal Medicine

## 2020-01-09 ENCOUNTER — Telehealth: Payer: Self-pay | Admitting: Radiology

## 2020-01-09 ENCOUNTER — Ambulatory Visit (INDEPENDENT_AMBULATORY_CARE_PROVIDER_SITE_OTHER): Payer: Medicare Other | Admitting: Internal Medicine

## 2020-01-09 VITALS — BP 154/82 | HR 81 | Ht 69.0 in | Wt 326.0 lb

## 2020-01-09 DIAGNOSIS — R072 Precordial pain: Secondary | ICD-10-CM | POA: Diagnosis not present

## 2020-01-09 DIAGNOSIS — R079 Chest pain, unspecified: Secondary | ICD-10-CM | POA: Diagnosis not present

## 2020-01-09 DIAGNOSIS — R002 Palpitations: Secondary | ICD-10-CM

## 2020-01-09 DIAGNOSIS — I493 Ventricular premature depolarization: Secondary | ICD-10-CM

## 2020-01-09 NOTE — Telephone Encounter (Signed)
Enrolled patient for a 3 day Zio monitor to be mailed to patients home.  

## 2020-01-09 NOTE — [place] Notes (Signed)
Cardiology Office Note:    Date:  01/09/2020   ID:  Connie Mosley, DOB Jan 11, 1961, MRN [id number]  PCP:  Jarold Motto, PA  Cardiologist:  No primary care provider on file.  Electrophysiologist:  None   Referring MD: Jarold Motto, PA   Chief Complaint: PVCs, chest pain  History of Present Illness:    Connie Mosley is a 59 y.o. female with a history of idiopathic autoimmune hemolytic anemia, bradycardia, hypertension, and irritable bowel syndrome who presents for relation of chest pain and PVCs.  She is a retired Engineer, civil (consulting).  Goes out for a walk with husband, chest pain with exertion, goes right away with rest. Occasional chest pain in past but didn't notice it until more recently. Minutes at a time, 2-3 mins, relieved by rest. Acitivty related chest pain. Not food related.  Not worse with deep breathing.  Chest pain will occur with house cleaning as well.  This has been going on for approximately 3 months.  She has also been noted to have PVCs.  She denies a sense of palpitations and is unaware of her PVCs.  This was noted on ECG from her PCPs office.  Given chest pain and PVCs, urgent referral to cardiology was requested.  TSH was obtained at PCP, normal.  She is prediabetic with an A1c of 6.1.  Normal hemoglobin and platelet count.  0.79.  Borderline elevated lipid profile with LDL 102.  HDL is 51, triglycerides 94.  She is a never smoker.  She is working on lifestyle factors for optimal diet and weight loss.  She is unaware of her family history as she is adopted.  Past Medical History:  Diagnosis Date  . Hypertension 09/11/2008  . Idiopathic autoimmune hemolytic anemia 2010   rx included in past prednisone, retuximab, splenectomy  . Irritable bowel syndrome 05/12/2012    Past Surgical History:  Procedure Laterality Date  . APPENDECTOMY  1982  . 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 TONSILECTOMY/ADENOIDECTOMY WITH MYRINGOTOMY     childhood   . total knee Left 2012    Current  Medications: Current Meds  Medication Sig  . cyanocobalamin (,VITAMIN B-12,) 1000 MCG/ML injection Inject into the muscle.  . famotidine (PEPCID) 40 MG tablet TAKE 1 TABLET BY MOUTH EVERY DAY  . folic acid (FOLVITE) 1 MG tablet Take by mouth.  Marland Kitchen lisinopril (ZESTRIL) 20 MG tablet Take 1 tablet (20 mg total) by mouth daily.     Allergies:   Cefuroxime axetil, Sulfamethoxazole-trimethoprim, and Cefuroxime   Social History   Socioeconomic History  . Marital status: Married    Spouse name: Not on file  . Number of children: Not on file  . Years of education: Not on file  . Highest education level: Not on file  Occupational History  . Occupation: Retired   Tobacco Use  . Smoking status: Never Smoker  . Smokeless tobacco: Never Used  Substance and Sexual Activity  . Alcohol use: Yes    Comment: wine 2 x a week  . Drug use: Never  . Sexual activity: Not on file  Other Topics Concern  . 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 Difficulty of Paying Living Expenses:   Food Insecurity:   . Worried About Programme researcher, broadcasting/film/video  in the Last Year:   . [place] in the Last Year:   Transportation Needs:   . Film/video editor (Medical):   Marland Kitchen Lack of Transportation (Non-Medical):   Physical Activity:   . Days of Exercise per Week:   . Minutes of Exercise per Session:   Stress:   . Feeling of Stress :   Social Connections:   . Frequency of Communication with Friends and Family:   . Frequency of Social Gatherings with Friends and Family:   . Attends Religious Services:   . Active Member of Clubs or Organizations:   . Attends Archivist Meetings:   Marland Kitchen Marital Status:      Family History: The patient's family history is not on file. She was adopted.  ROS:   Please see the  history of present illness.    All other systems reviewed and are negative.  EKGs/Labs/Other Studies Reviewed:    The following studies were reviewed today:  EKG:  SR, frequent PVCs, anterior infarct pattern.  Recent Labs: 01/05/2020: ALT 26; BUN 9; Creatinine, Ser 0.79; Hemoglobin 15.0; Platelets 258.0; Potassium 5.0; Sodium 140; TSH 0.85  Recent Lipid Panel    Component Value Date/Time   CHOL 172 01/05/2020 1017   TRIG 94.0 01/05/2020 1017   HDL 51.60 01/05/2020 1017   CHOLHDL 3 01/05/2020 1017   VLDL 18.8 01/05/2020 1017   LDLCALC 102 (H) 01/05/2020 1017    Physical Exam:    VS:  BP (!) 154/82   Pulse 81   Ht 5\' 9"  (1.753 m)   Wt (!) 326 lb (147.9 kg)   LMP 01/04/2010   SpO2 97%   BMI 48.14 kg/m     Wt Readings from Last 5 Encounters:  01/09/20 (!) 326 lb (147.9 kg)  01/05/20 (!) 323 lb (146.5 kg)  01/05/20 (!) 323 lb (146.5 kg)  03/25/19 299 lb (135.6 kg)     Constitutional: No acute distress Eyes: sclera non-icteric, normal conjunctiva and lids ENMT: normal dentition, moist mucous membranes Cardiovascular: Irregular rhythm, normal rate, no murmurs. S1 and S2 normal. Radial pulses normal bilaterally. No jugular venous distention.  Respiratory: clear to auscultation bilaterally GI : normal bowel sounds, soft and nontender. No distention.   MSK: extremities warm, well perfused. No edema.  NEURO: grossly nonfocal exam, moves all extremities. PSYCH: alert and oriented x 3, normal mood and affect.   ASSESSMENT:    1. PVCs (premature ventricular contractions)   2. Chest pain, unspecified type   3. Precordial pain    PLAN:    PVCs (premature ventricular contractions) - Plan: EKG 12-Lead, LONG TERM MONITOR (3-14 DAYS)  We will need to quantitate PVCs to determine if treatment is needed, particularly in light of chest pain.  We will perform an extended cardiac monitor.  We will perform a 3-day monitor.  Chest pain, unspecified type - Plan: EKG 12-Lead,  MYOCARDIAL PERFUSION IMAGING  For chest pain in the setting of prediabetes, obesity, and hypertension, we will need to exclude ischemia.  I discussed this in detail with the patient and we have participated in shared decision making.  We will obtain a stress Myoview to further evaluate.  If the EF on Myoview is abnormal, we will need to obtain an echocardiogram particularly in light of frequent PVCs.  Hypertension-continue lisinopril   Cherlynn Kaiser, MD [place]  [facility]    Medication Adjustments/Labs and Tests Ordered: Current medicines are reviewed at length with the patient today.  Concerns  regarding medicines are outlined above.  Orders Placed This Encounter  Procedures  . MYOCARDIAL PERFUSION IMAGING  . LONG TERM MONITOR (3-14 DAYS)  . EKG 12-Lead   No orders of the defined types were placed in this encounter.   Patient Instructions  Medication Instructions:  NO CHANGES  *If you need a refill on your cardiac medications before your next appointment, please call your pharmacy*   Lab Work: NOT NEEDED  Testing/Procedures: WILL BE SCHEDULE AT [address] Your physician has requested that you have a lexiscan myoview. Please follow instruction sheet, as given.  AND  Your physician has recommended that you wear a  3 DAY ZIO-PATCH monitor. The Zio patch cardiac monitor continuously records heart rhythm data for up to 14 days, this is for patients being evaluated for multiple types heart rhythms. For the first 24 hours post application, please avoid getting the Zio monitor wet in the shower or by excessive sweating during exercise. After that, feel free to carry on with regular activities. Keep soaps and lotions away from the ZIO XT Patch.  This will be mailed to you, please expect 7-10 days to receive.  [place] location - [address], [address].         Follow-Up: At [facility], you and your health needs are our priority.  As  part of our continuing mission to provide you with exceptional heart care, we have created designated Provider Care Teams.  These Care Teams include your primary Cardiologist (physician) and Advanced Practice Providers (APPs -  Physician Assistants and Nurse Practitioners) who all work together to provide you with the care you need, when you need it.  We recommend signing up for the patient portal called "MyChart".  Sign up information is provided on this After Visit Summary.  MyChart is used to connect with patients for Virtual Visits (Telemedicine).  Patients are able to view lab/test results, encounter notes, upcoming appointments, etc.  Non-urgent messages can be sent to your provider as well.   To learn more about what you can do with MyChart, go to [email or web address].    Your next appointment:   6 week(s)  The format for your next appointment:   In Person  Provider:   Weston Brass, MD   Other Instructions N/A   Christena Deem- Long Term Monitor Instructions   Your physician has requested you wear your ZIO patch monitor__3_____days.   This is a single patch monitor.  Ir[employer] supplies one patch monitor per enrollment.  Additional stickers are not available.   Please do not apply patch if you will be having a Nuclear Stress Test, Echocardiogram, Cardiac CT, MRI, or Chest Xray during the time frame you would be wearing the monitor. The patch cannot be worn during these tests.  You cannot remove and re-apply the ZIO XT patch monitor.   Your ZIO patch monitor will be sent USPS Priority mail from [facility] directly to your home address. The monitor may also be mailed to a PO BOX if home delivery is not available.   It may take 3-5 days to receive your monitor after you have been enrolled.   Once you have received you monitor, please review enclosed instructions.  Your monitor has already been registered assigning a specific monitor serial # to you.   Applying the monitor    Shave hair from upper left chest.   Hold abrader disc by orange tab.  Rub abrader in 40 strokes over left  upper chest as indicated in your monitor instructions.   Clean area with 4 enclosed alcohol pads .  Use all pads to assure are is cleaned thoroughly.  Let dry.   Apply patch as indicated in monitor instructions.  Patch will be place under collarbone on left side of chest with arrow pointing upward.   Rub patch adhesive wings for 2 minutes.Remove white label marked "1".  Remove white label marked "2".  Rub patch adhesive wings for 2 additional minutes.   While looking in a mirror, press and release button in center of patch.  A small green light will flash 3-4 times .  This will be your only indicator the monitor has been turned on.     Do not shower for the first 24 hours.  You may shower after the first 24 hours.   Press button if you feel a symptom. You will hear a small click.  Record Date, Time and Symptom in the Patient Log Book.   When you are ready to remove patch, follow instructions on last 2 pages of Patient Log Book.  Stick patch monitor onto last page of Patient Log Book.   Place Patient Log Book in [place] box.  Use locking tab on box and tape box closed securely.  The Orange and [employer] has [employer] on it.  Please place in mailbox as soon as possible.  Your physician should have your test results approximately 7 days after the monitor has been mailed back to [facility].   Call [facility] Customer Care at [phone] if you have questions regarding your ZIO XT patch monitor.  Call them immediately if you see an orange light blinking on your monitor.   If your monitor falls off in less than 4 days contact our Monitor department at [phone].  If your monitor becomes loose or falls off after 4 days call [employer] at [phone] for suggestions on securing your monitor.

## 2020-01-09 NOTE — Patient Instructions (Signed)
Medication Instructions:  NO CHANGES  *If you need a refill on your cardiac medications before your next appointment, please call your pharmacy*   Lab Work: NOT NEEDED  Testing/Procedures: WILL BE SCHEDULE AT [place] [address] Your physician has requested that you have a lexiscan myoview. Please follow instruction sheet, as given.  AND  Your physician has recommended that you wear a  3 DAY ZIO-PATCH monitor. The Zio patch cardiac monitor continuously records heart rhythm data for up to 14 days, this is for patients being evaluated for multiple types heart rhythms. For the first 24 hours post application, please avoid getting the Zio monitor wet in the shower or by excessive sweating during exercise. After that, feel free to carry on with regular activities. Keep soaps and lotions away from the ZIO XT Patch.  This will be mailed to you, please expect 7-10 days to receive.  [employer] location - [place], [address].         Follow-Up: At [facility], you and your health needs are our priority.  As part of our continuing mission to provide you with exceptional heart care, we have created designated Provider Care Teams.  These Care Teams include your primary Cardiologist (physician) and Advanced Practice Providers (APPs -  Physician Assistants and Nurse Practitioners) who all work together to provide you with the care you need, when you need it.  We recommend signing up for the patient portal called "MyChart".  Sign up information is provided on this After Visit Summary.  MyChart is used to connect with patients for Virtual Visits (Telemedicine).  Patients are able to view lab/test results, encounter notes, upcoming appointments, etc.  Non-urgent messages can be sent to your provider as well.   To learn more about what you can do with MyChart, go to [email or web address].    Your next appointment:   6 week(s)  The format for your next appointment:   In  Person  Provider:   Cherlynn Kaiser, MD   Other Instructions N/A   Bryn Gulling- Long Term Monitor Instructions   Your physician has requested you wear your ZIO patch monitor__3_____days.   This is a single patch monitor.  Irhythm supplies one patch monitor per enrollment.  Additional stickers are not available.   Please do not apply patch if you will be having a Nuclear Stress Test, Echocardiogram, Cardiac CT, MRI, or Chest Xray during the time frame you would be wearing the monitor. The patch cannot be worn during these tests.  You cannot remove and re-apply the ZIO XT patch monitor.   Your ZIO patch monitor will be sent USPS Priority mail from [facility] directly to your home address. The monitor may also be mailed to a PO BOX if home delivery is not available.   It may take 3-5 days to receive your monitor after you have been enrolled.   Once you have received you monitor, please review enclosed instructions.  Your monitor has already been registered assigning a specific monitor serial # to you.   Applying the monitor   Shave hair from upper left chest.   Hold abrader disc by orange tab.  Rub abrader in 40 strokes over left upper chest as indicated in your monitor instructions.   Clean area with 4 enclosed alcohol pads .  Use all pads to assure are is cleaned thoroughly.  Let dry.   Apply patch as indicated in monitor instructions.  Patch will be place under collarbone  on left side of chest with arrow pointing upward.   Rub patch adhesive wings for 2 minutes.Remove white label marked "1".  Remove white label marked "2".  Rub patch adhesive wings for 2 additional minutes.   While looking in a mirror, press and release button in center of patch.  A small green light will flash 3-4 times .  This will be your only indicator the monitor has been turned on.     Do not shower for the first 24 hours.  You may shower after the first 24 hours.   Press button if you feel a symptom.  You will hear a small click.  Record Date, Time and Symptom in the Patient Log Book.   When you are ready to remove patch, follow instructions on last 2 pages of Patient Log Book.  Stick patch monitor onto last page of Patient Log Book.   Place Patient Log Book in [place] box.  Use locking tab on box and tape box closed securely.  The [employer] and [employer] has [employer] on it.  Please place in mailbox as soon as possible.  Your physician should have your test results approximately 7 days after the monitor has been mailed back to [facility].   Call [facility] Customer Care at [phone] if you have questions regarding your ZIO XT patch monitor.  Call them immediately if you see an orange light blinking on your monitor.   If your monitor falls off in less than 4 days contact our Monitor department at [phone].  If your monitor becomes loose or falls off after 4 days call [employer] at [phone] for suggestions on securing your monitor.

## 2020-01-10 ENCOUNTER — Other Ambulatory Visit: Payer: Self-pay | Admitting: Physician Assistant

## 2020-01-13 ENCOUNTER — Other Ambulatory Visit (INDEPENDENT_AMBULATORY_CARE_PROVIDER_SITE_OTHER): Payer: Medicare Other

## 2020-01-13 DIAGNOSIS — R002 Palpitations: Secondary | ICD-10-CM

## 2020-01-13 DIAGNOSIS — I493 Ventricular premature depolarization: Secondary | ICD-10-CM | POA: Diagnosis not present

## 2020-01-13 DIAGNOSIS — R079 Chest pain, unspecified: Secondary | ICD-10-CM | POA: Diagnosis not present

## 2020-01-20 ENCOUNTER — Encounter (HOSPITAL_COMMUNITY): Payer: Medicare Other

## 2020-01-27 ENCOUNTER — Ambulatory Visit (HOSPITAL_COMMUNITY): Payer: Medicare Other

## 2020-01-28 ENCOUNTER — Ambulatory Visit (HOSPITAL_COMMUNITY): Payer: Medicare Other

## 2020-01-28 ENCOUNTER — Encounter (HOSPITAL_COMMUNITY): Payer: Self-pay | Admitting: *Deleted

## 2020-01-28 ENCOUNTER — Telehealth (HOSPITAL_COMMUNITY): Payer: Self-pay | Admitting: *Deleted

## 2020-01-28 NOTE — Telephone Encounter (Signed)
Instruction letter sent through MyChart for upcoming stress test schedule for 02/02/20 @ 7:30. Connie Mosley

## 2020-01-29 ENCOUNTER — Ambulatory Visit (HOSPITAL_COMMUNITY): Payer: Medicare Other

## 2020-02-02 ENCOUNTER — Ambulatory Visit (HOSPITAL_COMMUNITY): Payer: Medicare Other | Attending: Cardiovascular Disease

## 2020-02-02 ENCOUNTER — Other Ambulatory Visit: Payer: Self-pay

## 2020-02-02 DIAGNOSIS — I493 Ventricular premature depolarization: Secondary | ICD-10-CM

## 2020-02-02 DIAGNOSIS — R079 Chest pain, unspecified: Secondary | ICD-10-CM | POA: Diagnosis present

## 2020-02-02 DIAGNOSIS — R072 Precordial pain: Secondary | ICD-10-CM

## 2020-02-02 DIAGNOSIS — R002 Palpitations: Secondary | ICD-10-CM

## 2020-02-02 MED ORDER — REGADENOSON 0.4 MG/5ML IV SOLN
0.4000 mg | Freq: Once | INTRAVENOUS | Status: AC
  Administered 2020-02-02: 0.4 mg via INTRAVENOUS

## 2020-02-02 MED ORDER — TECHNETIUM TC 99M TETROFOSMIN IV KIT
32.7000 | PACK | Freq: Once | INTRAVENOUS | Status: AC | PRN
  Administered 2020-02-02: 32.7 via INTRAVENOUS
  Filled 2020-02-02: qty 33

## 2020-02-03 ENCOUNTER — Ambulatory Visit (HOSPITAL_COMMUNITY): Payer: Medicare Other | Attending: Internal Medicine

## 2020-02-03 LAB — MYOCARDIAL PERFUSION IMAGING
LV dias vol: 135 mL (ref 46–106)
LV sys vol: 64 mL
Peak HR: 100 {beats}/min
Rest HR: 75 {beats}/min
SDS: 2
SRS: 0
SSS: 2
TID: 0.89

## 2020-02-03 MED ORDER — TECHNETIUM TC 99M TETROFOSMIN IV KIT
31.3000 | PACK | Freq: Once | INTRAVENOUS | Status: AC | PRN
  Administered 2020-02-03: 31.3 via INTRAVENOUS
  Filled 2020-02-03: qty 32

## 2020-03-03 ENCOUNTER — Telehealth: Payer: Self-pay | Admitting: *Deleted

## 2020-03-03 NOTE — Telephone Encounter (Signed)
Spoke to patient .  Requested for patient to change appointment time to accomodate virtual visit  appt changed to 8 am om 03/04/20.

## 2020-03-04 ENCOUNTER — Telehealth (INDEPENDENT_AMBULATORY_CARE_PROVIDER_SITE_OTHER): Payer: Medicare Other | Admitting: Internal Medicine

## 2020-03-04 ENCOUNTER — Encounter: Payer: Self-pay | Admitting: Internal Medicine

## 2020-03-04 VITALS — BP 132/80 | HR 62 | Ht 69.0 in | Wt 320.0 lb

## 2020-03-04 DIAGNOSIS — R079 Chest pain, unspecified: Secondary | ICD-10-CM

## 2020-03-04 DIAGNOSIS — I471 Supraventricular tachycardia: Secondary | ICD-10-CM | POA: Diagnosis not present

## 2020-03-04 DIAGNOSIS — I493 Ventricular premature depolarization: Secondary | ICD-10-CM

## 2020-03-04 DIAGNOSIS — I10 Essential (primary) hypertension: Secondary | ICD-10-CM

## 2020-03-04 DIAGNOSIS — Z79899 Other long term (current) drug therapy: Secondary | ICD-10-CM

## 2020-03-04 DIAGNOSIS — R002 Palpitations: Secondary | ICD-10-CM

## 2020-03-04 MED ORDER — METOPROLOL TARTRATE 25 MG PO TABS
12.5000 mg | ORAL_TABLET | Freq: Two times a day (BID) | ORAL | 3 refills | Status: DC
Start: 2020-03-04 — End: 2020-05-31

## 2020-03-04 NOTE — Patient Instructions (Addendum)
Medication Instructions:   Start Metoprolol tartrate 12.5 mg ( 1/2 TABLET OF 25 MG)  Twice a day.  *If you need a refill on your cardiac medications before your next appointment, please call your pharmacy*   Lab Work: Not needed If you have labs (blood work) drawn today and your tests are completely normal, you will receive your results only by:  MyChart Message (if you have MyChart) OR  A paper copy in the mail If you have any lab test that is abnormal or we need to change your treatment, we will call you to review the results.   Testing/Procedures: Will be schedule at [facility] [address] Your physician has requested that you have an echocardiogram. Echocardiography is a painless test that uses sound waves to create images of your heart. It provides your doctor with information about the size and shape of your heart and how well your hearts chambers and valves are working. This procedure takes approximately one hour. There are no restrictions for this procedure.     Follow-Up: At [facility], you and your health needs are our priority.  As part of our continuing mission to provide you with exceptional heart care, we have created designated Provider Care Teams.  These Care Teams include your primary Cardiologist (physician) and Advanced Practice Providers (APPs -  Physician Assistants and Nurse Practitioners) who all work together to provide you with the care you need, when you need it.    Your next appointment:   3 month(s)  The format for your next appointment:   In Person  Provider:   Weston Brass, MD

## 2020-03-04 NOTE — [place] Notes (Signed)
Virtual Visit via Telephone Note   This visit type was conducted due to national recommendations for restrictions regarding the COVID-19 Pandemic (e.g. social distancing) in an effort to limit this patient's exposure and mitigate transmission in our community.  Due to her co-morbid illnesses, this patient is at least at moderate risk for complications without adequate follow up.  This format is felt to be most appropriate for this patient at this time.  The patient did not have access to video technology/had technical difficulties with video requiring transitioning to audio format only (telephone).  All issues noted in this document were discussed and addressed.  No physical exam could be performed with this format.  Please refer to the patient's chart for her  consent to telehealth for Connie Mosley.   The patient was identified using 2 identifiers.  Date:  03/04/2020   ID:  Connie Mosley, DOB 11-Feb-1961, MRN [id number]  Patient Location: Home Provider Location: Home Office  PCP:  Jarold Motto, PA  Cardiologist:  Parke Poisson, MD  Electrophysiologist:  None   Evaluation Performed:  Follow-Up Visit  Chief Complaint:  Follow up palpitations, CP  History of Present Illness:    Connie Mosley is a 59 y.o. female with history of idiopathic autoimmune hemolytic anemia, bradycardia, hypertension, and irritable bowel syndrome who presents for evaluation of chest pain and PVCs.  She is a retired Engineer, civil (consulting).   Still having palpitations and low pulse. Consumes coffee 1-1.5 cups a day. Associated SOB.  Alcohol: weekly - 2 drinks a week.  Water intake: well hydrated Snoring: no TSH: normal at PCP office. Herbal supplements/diet products: none Syncope/presyncope: no syncope  The patient does not have symptoms concerning for COVID-19 infection (fever, chills, cough, or new shortness of breath).    Past Medical History:  Diagnosis Date  . Hypertension 09/11/2008  . Idiopathic autoimmune  hemolytic anemia 2010   rx included in past prednisone, retuximab, splenectomy  . Irritable bowel syndrome 05/12/2012   Past Surgical History:  Procedure Laterality Date  . APPENDECTOMY  1982  . 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 TONSILECTOMY/ADENOIDECTOMY WITH MYRINGOTOMY     childhood   . total knee Left 2012     Current Meds  Medication Sig  . cyanocobalamin (,VITAMIN B-12,) 1000 MCG/ML injection Inject into the muscle.  . famotidine (PEPCID) 40 MG tablet TAKE 1 TABLET BY MOUTH EVERY DAY  . folic acid (FOLVITE) 1 MG tablet Take by mouth.  Marland Kitchen lisinopril (ZESTRIL) 20 MG tablet TAKE 1 TABLET BY MOUTH EVERY DAY     Allergies:   Cefuroxime axetil, Sulfamethoxazole-trimethoprim, and Cefuroxime   Social History   Tobacco Use  . Smoking status: Never Smoker  . Smokeless tobacco: Never Used  Substance Use Topics  . Alcohol use: Yes    Comment: wine 2 x a week  . Drug use: Never     Family Hx: The patient's family history is not on file. She was adopted.  ROS:   Please see the history of present illness.     All other systems reviewed and are negative.   Prior CV studies:   The following studies were reviewed today:    Labs/Other Tests and Data Reviewed:    EKG:  No ECG reviewed.  Recent Labs: 01/05/2020: ALT 26; BUN 9; Creatinine, Ser 0.79; Hemoglobin 15.0; Platelets 258.0; Potassium 5.0; Sodium 140; TSH 0.85   Recent Lipid Panel Lab Results  Component Value Date/Time   CHOL 172 01/05/2020 10:17 AM   TRIG 94.0 01/05/2020  10:17 AM   HDL 51.60 01/05/2020 10:17 AM   CHOLHDL 3 01/05/2020 10:17 AM   LDLCALC 102 (H) 01/05/2020 10:17 AM    Wt Readings from Last 3 Encounters:  03/04/20 (!) 320 lb (145.2 kg)  02/02/20 (!) 326 lb (147.9 kg)  01/09/20 (!) 326 lb (147.9 kg)     Objective:    Vital Signs:  BP 132/80   Pulse 62   Ht 5\' 9"  (1.753 m)   Wt (!) 320 lb (145.2 kg)   LMP 01/04/2010   BMI 47.26 kg/m    VITAL SIGNS:  reviewed GEN:  no acute  distress RESPIRATORY:  normal respiratory effort, no increased work of breathing NEURO:  alert and oriented x 3, speech normal PSYCH:  normal affect   ASSESSMENT & PLAN:    1. PVCs (premature ventricular contractions)   2. SVT (supraventricular tachycardia) (HCC)   3. Palpitations   4. Chest pain, unspecified type   5. Essential hypertension    Palpitations - reviewed monitor results with symptomatic PVCs. Will start metoprolol 12.5 mg BID.   Chest pain - no significant recurrence. Normal stress test with frequent PVCs.   HTN - continue lisinopril 20 mg daily.   COVID-19 Education: The signs and symptoms of COVID-19 were discussed with the patient and how to seek care for testing (follow up with PCP or arrange E-visit).  The importance of social distancing was discussed today.  Time:   Today, I have spent 15 minutes with the patient with telehealth technology discussing the above problems.     Medication Adjustments/Labs and Tests Ordered: Current medicines are reviewed at length with the patient today.  Concerns regarding medicines are outlined above.   Tests Ordered: Orders Placed This Encounter  Procedures  . ECHOCARDIOGRAM COMPLETE    Medication Changes: Meds ordered this encounter  Medications  . metoprolol tartrate (LOPRESSOR) 25 MG tablet    Sig: Take 0.5 tablets (12.5 mg total) by mouth 2 (two) times daily.    Dispense:  90 tablet    Refill:  3    Follow Up: 3 mo  Signed, [name], MD  03/04/2020 8:12 AM    [place]

## 2020-03-30 ENCOUNTER — Ambulatory Visit (HOSPITAL_COMMUNITY): Payer: Medicare Other | Attending: Cardiology

## 2020-03-30 ENCOUNTER — Other Ambulatory Visit: Payer: Self-pay

## 2020-03-30 DIAGNOSIS — I471 Supraventricular tachycardia: Secondary | ICD-10-CM | POA: Insufficient documentation

## 2020-03-30 DIAGNOSIS — I493 Ventricular premature depolarization: Secondary | ICD-10-CM | POA: Diagnosis present

## 2020-03-30 LAB — ECHOCARDIOGRAM COMPLETE
Area-P 1/2: 3.06 cm2
S' Lateral: 4.2 cm

## 2020-03-30 MED ORDER — PERFLUTREN LIPID MICROSPHERE
1.0000 mL | INTRAVENOUS | Status: AC | PRN
  Administered 2020-03-30: 2 mL via INTRAVENOUS

## 2020-04-27 ENCOUNTER — Other Ambulatory Visit: Payer: Self-pay | Admitting: *Deleted

## 2020-04-27 DIAGNOSIS — I517 Cardiomegaly: Secondary | ICD-10-CM

## 2020-04-27 DIAGNOSIS — I471 Supraventricular tachycardia: Secondary | ICD-10-CM

## 2020-04-27 DIAGNOSIS — I493 Ventricular premature depolarization: Secondary | ICD-10-CM

## 2020-04-29 ENCOUNTER — Other Ambulatory Visit: Payer: Self-pay | Admitting: Physician Assistant

## 2020-05-31 ENCOUNTER — Other Ambulatory Visit: Payer: Self-pay

## 2020-05-31 ENCOUNTER — Ambulatory Visit: Payer: Medicare Other | Admitting: Internal Medicine

## 2020-05-31 ENCOUNTER — Encounter: Payer: Self-pay | Admitting: Internal Medicine

## 2020-05-31 VITALS — BP 144/80 | HR 84 | Temp 97.0°F | Ht 68.0 in | Wt 315.6 lb

## 2020-05-31 DIAGNOSIS — Z79899 Other long term (current) drug therapy: Secondary | ICD-10-CM

## 2020-05-31 DIAGNOSIS — R002 Palpitations: Secondary | ICD-10-CM

## 2020-05-31 DIAGNOSIS — I10 Essential (primary) hypertension: Secondary | ICD-10-CM

## 2020-05-31 DIAGNOSIS — I493 Ventricular premature depolarization: Secondary | ICD-10-CM

## 2020-05-31 DIAGNOSIS — I517 Cardiomegaly: Secondary | ICD-10-CM | POA: Diagnosis not present

## 2020-05-31 MED ORDER — METOPROLOL TARTRATE 25 MG PO TABS: 25.0000 mg | ORAL_TABLET | Freq: Two times a day (BID) | ORAL | 4 refills | Status: DC

## 2020-05-31 NOTE — Patient Instructions (Signed)
Medication Instructions:  CONTINUE WITH METOPROLOL 25mg  (1tablet) twice daily MAY TAKE ADDITIONAL DOSE (25mg ) AS NEEDED1 HOUR PRIOR TO EXERCISE *If you need a refill on your cardiac medications before your next appointment, please call your pharmacy*  Lab Work: None Ordered At This Time.  If you have labs (blood work) drawn today and your tests are completely normal, you will receive your results only by:  MyChart Message (if you have MyChart) OR  A paper copy in the mail If you have any lab test that is abnormal or we need to change your treatment, we will call you to review the results.  Testing/Procedures: None Ordered At This Time.   Follow-Up: At [facility], you and your health needs are our priority.  As part of our continuing mission to provide you with exceptional heart care, we have created designated Provider Care Teams.  These Care Teams include your primary Cardiologist (physician) and Advanced Practice Providers (APPs -  Physician Assistants and Nurse Practitioners) who all work together to provide you with the care you need, when you need it.  Your next appointment:   3 month(s)  The format for your next appointment:   In Person  Provider:   , MD

## 2020-05-31 NOTE — Progress Notes (Signed)
Cardiology Office Note:    Date:  05/31/2020   ID:  Tanetta Fuhriman, DOB 1961-03-11, MRN [id number]  PCP:  Jarold Motto, PA  Cardiologist:  Parke Poisson, MD  Electrophysiologist:  None   Referring MD: Jarold Motto, PA   Chief Complaint/Reason for Referral: PVCS  History of Present Illness:    Connie Mosley is a 59 y.o. female with a history of idiopathic autoimmune hemolytic anemia, bradycardia, hypertension, and irritable bowel syndrome who presented initially for evaluation of chest pain and PVCs.  She is a retired Engineer, civil (consulting).    We discussed irregular pulse ox readings due to frequent ectopy.  Discussed measuring her pulse manually, even by auscultation since some PVCs may not be perfused.  She has approximately a 3.5% burden of PVCs daily.  She feels she is getting used to the palpitations, however they are most bothersome when she exercises.  [facility] [place] [facility] She will feel increased palpitations and SOB. No chest pain. No syncope or lightheadedness. She is currently taking metoprolol 25 mg BID.    Past Medical History:  Diagnosis Date  . Hypertension 09/11/2008  . Idiopathic autoimmune hemolytic anemia 2010   rx included in past prednisone, retuximab, splenectomy  . Irritable bowel syndrome 05/12/2012    Past Surgical History:  Procedure Laterality Date  . APPENDECTOMY  1982  . 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 TONSILECTOMY/ADENOIDECTOMY WITH MYRINGOTOMY     childhood   . total knee Left 2012    Current Medications: Current Meds  Medication Sig  . cyanocobalamin (,VITAMIN B-12,) 1000 MCG/ML injection Inject into the muscle.  . famotidine (PEPCID) 40 MG tablet TAKE 1 TABLET BY MOUTH EVERY DAY  . folic acid (FOLVITE) 1 MG tablet Take by mouth.  Marland Kitchen lisinopril (ZESTRIL) 20 MG tablet TAKE 1 TABLET BY MOUTH EVERY DAY  . metoprolol tartrate (LOPRESSOR) 25 MG tablet Take 1 tablet (25 mg total) by mouth 2 (two) times daily. May take Additional 25 mg(1tablet) as  needed prior to exercise (within 1 hour)  . [DISCONTINUED] metoprolol tartrate (LOPRESSOR) 25 MG tablet Take 0.5 tablets (12.5 mg total) by mouth 2 (two) times daily.     Allergies:   Cefuroxime axetil, Sulfamethoxazole-trimethoprim, and Cefuroxime   Social History   Tobacco Use  . Smoking status: Never Smoker  . Smokeless tobacco: Never Used  Substance Use Topics  . Alcohol use: Yes    Comment: wine 2 x a week  . Drug use: Never     Family History: The patient's family history is not on file. She was adopted.  ROS:   Please see the history of present illness.    All other systems reviewed and are negative.  EKGs/Labs/Other Studies Reviewed:    The following studies were reviewed today:  EKG:  SR with frequent PVCs  Recent Labs: 01/05/2020: ALT 26; BUN 9; Creatinine, Ser 0.79; Hemoglobin 15.0; Platelets 258.0; Potassium 5.0; Sodium 140; TSH 0.85  Recent Lipid Panel    Component Value Date/Time   CHOL 172 01/05/2020 1017   TRIG 94.0 01/05/2020 1017   HDL 51.60 01/05/2020 1017   CHOLHDL 3 01/05/2020 1017   VLDL 18.8 01/05/2020 1017   LDLCALC 102 (H) 01/05/2020 1017    Physical Exam:    VS:  BP (!) 144/80   Pulse 84   Temp (!) 97 F (36.1 C)   Ht 5\' 8"  (1.727 m)   Wt (!) 315 lb 9.6 oz (143.2 kg)   LMP 01/04/2010  SpO2 94%   BMI 47.99 kg/m     Wt Readings from Last 5 Encounters:  05/31/20 (!) 315 lb 9.6 oz (143.2 kg)  03/04/20 (!) 320 lb (145.2 kg)  02/02/20 (!) 326 lb (147.9 kg)  01/09/20 (!) 326 lb (147.9 kg)  01/05/20 (!) 323 lb (146.5 kg)    Constitutional: No acute distress Eyes: sclera non-icteric, normal conjunctiva and lids ENMT: normal dentition, moist mucous membranes Cardiovascular: irregular rhythm, normal rate, no murmurs. S1 and S2 normal. Radial pulses normal bilaterally. No jugular venous distention.  Respiratory: clear to auscultation bilaterally GI : normal bowel sounds, soft and nontender. No distention.   MSK: extremities warm, well  perfused. No edema.  NEURO: grossly nonfocal exam, moves all extremities. PSYCH: alert and oriented x 3, normal mood and affect.   ASSESSMENT:    1. PVCs (premature ventricular contractions)   2. Palpitations   3. Hypertension, unspecified type   4. LVH (left ventricular hypertrophy)   5. Medication management    PLAN:    PVCs (premature ventricular contractions) Palpitations -We discussed various strategies of PVC control, including increase BB dose, prn BB usage before exercise, and EP visit for initiation of AAD. We participated in shared decision making and determined that prn use of metoprolol 25 mg 1 hour prior to exercise would be a reasonable strategy. If she experiences no change or worsening symptoms, will consider performing ETT for chronotropic (iatrogenic) incompetence and burden of ectopy.   Hypertension, unspecified type - Plan: EKG 12-Lead -situationally elevated BP today (unmasked patron in crowded elevator). Will observe. Continue metoprolol 25 mg BID with prn for palpitations, and lisinopril 20 mg daily.   Repeat echo in December to reassess LV dimensions.   Total time of encounter: 30 minutes total time of encounter, including 25 minutes spent in face-to-face patient care on the date of this encounter. This time includes coordination of care and counseling regarding above mentioned problem list. Remainder of non-face-to-face time involved reviewing chart documents/testing relevant to the patient encounter and documentation in the medical record. I have independently reviewed documentation from referring provider.   Weston Brass, MD [place]  [facility]    Medication Adjustments/Labs and Tests Ordered: Current medicines are reviewed at length with the patient today.  Concerns regarding medicines are outlined above.   Orders Placed This Encounter  Procedures  . EKG 12-Lead    Meds ordered this encounter  Medications  . metoprolol tartrate  (LOPRESSOR) 25 MG tablet    Sig: Take 1 tablet (25 mg total) by mouth 2 (two) times daily. May take Additional 25 mg(1tablet) as needed prior to exercise (within 1 hour)    Dispense:  90 tablet    Refill:  4    Patient Instructions  Medication Instructions:  CONTINUE WITH METOPROLOL 25mg  (1tablet) twice daily MAY TAKE ADDITIONAL DOSE (25mg ) AS NEEDED1 HOUR PRIOR TO EXERCISE *If you need a refill on your cardiac medications before your next appointment, please call your pharmacy*  Lab Work: None Ordered At This Time.  If you have labs (blood work) drawn today and your tests are completely normal, you will receive your results only by: MyChart Message (if you have MyChart) OR . A paper copy in the mail If you have any lab test that is abnormal or we need to change your treatment, we will call you to review the results.  Testing/Procedures: None Ordered At This Time.   Follow-Up: At [facility], you and your health needs are our  priority.  As part of our continuing mission to provide you with exceptional heart care, we have created designated Provider Care Teams.  These Care Teams include your primary Cardiologist (physician) and Advanced Practice Providers (APPs -  Physician Assistants and Nurse Practitioners) who all work together to provide you with the care you need, when you need it.  Your next appointment:   3 month(s)  The format for your next appointment:   In Person  Provider:   Weston Brass, MD

## 2020-07-04 ENCOUNTER — Other Ambulatory Visit: Payer: Self-pay | Admitting: Physician Assistant

## 2020-08-21 ENCOUNTER — Other Ambulatory Visit: Payer: Self-pay | Admitting: Internal Medicine

## 2020-08-27 ENCOUNTER — Telehealth (INDEPENDENT_AMBULATORY_CARE_PROVIDER_SITE_OTHER): Payer: Medicare Other | Admitting: Internal Medicine

## 2020-08-27 ENCOUNTER — Encounter: Payer: Self-pay | Admitting: Internal Medicine

## 2020-08-27 VITALS — BP 124/82 | HR 62 | Ht 69.0 in | Wt 307.0 lb

## 2020-08-27 DIAGNOSIS — R002 Palpitations: Secondary | ICD-10-CM

## 2020-08-27 DIAGNOSIS — I10 Essential (primary) hypertension: Secondary | ICD-10-CM

## 2020-08-27 DIAGNOSIS — I493 Ventricular premature depolarization: Secondary | ICD-10-CM | POA: Diagnosis not present

## 2020-08-27 DIAGNOSIS — R079 Chest pain, unspecified: Secondary | ICD-10-CM

## 2020-08-27 DIAGNOSIS — Z79899 Other long term (current) drug therapy: Secondary | ICD-10-CM

## 2020-08-27 NOTE — Progress Notes (Signed)
Virtual Visit via Video Note   This visit type was conducted due to national recommendations for restrictions regarding the COVID-19 Pandemic (e.g. social distancing) in an effort to limit this patient's exposure and mitigate transmission in our community.  Due to her co-morbid illnesses, this patient is at least at moderate risk for complications without adequate follow up.  This format is felt to be most appropriate for this patient at this time.  All issues noted in this document were discussed and addressed.  A limited physical exam was performed with this format.  Please refer to the patient's chart for her consent to telehealth for [facility].  Date:  08/27/2020   ID:  Connie Mosley, DOB 1961/09/08, MRN [id number] The patient was identified using 2 identifiers.  Patient Location: Home Provider Location: Home Office  PCP:  Jarold Motto, [place]  Cardiologist:  Parke Poisson, MD  Electrophysiologist:  None   Evaluation Performed:  Follow-Up Visit  Chief Complaint:  Follow up PVCs  History of Present Illness:    Connie Mosley is a 59 y.o. female with idiopathic autoimmune hemolytic anemia, bradycardia, hypertension, and irritable bowel syndrome who presents for follow up of chest pain and PVCs.  She is a retired Engineer, civil (consulting). She is originally from [place], and is looking forward to visiting her family there soon, since she has a new grandchild who is only a few months old. We discussed COVID 19 omicron variant precautions, she is vaccinated and has had a booster.   She denies recurrent chest pain. She has not required any prn doses of metoprolol, and continues to take meotprolol tartrate 25 mg BID with excellent control of symptoms. Will have rare palpitations. No SOB, syncope.   The patient does not have symptoms concerning for COVID-19 infection (fever, chills, cough, or new shortness of breath).    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   famotidine (PEPCID) 40 MG tablet TAKE 1 TABLET BY MOUTH EVERY DAY   folic acid (FOLVITE) 1 MG tablet Take by mouth.   lisinopril (ZESTRIL) 20 MG tablet TAKE 1 TABLET BY MOUTH EVERY DAY   metoprolol tartrate (LOPRESSOR) 25 MG tablet Take 1 tablet (25 mg total) by mouth 2 (two) times daily. May take Additional 25 mg(1tablet) as needed prior to exercise (within 1 hour)     Allergies:   Cefuroxime axetil, Sulfamethoxazole-trimethoprim, and Cefuroxime   Social History   Tobacco Use   Smoking status: Never Smoker   Smokeless tobacco: Never Used  Substance Use Topics   Alcohol use: Yes    Comment: wine 2 x a week   Drug use: Never     Family Hx: The patient's family history is not on file. She was adopted.  ROS:   Please see the history of present illness.     All other systems reviewed and are negative.   Prior CV studies:   The following studies were reviewed today:    Labs/Other Tests and Data Reviewed:    EKG:  No ECG reviewed.  Recent Labs: 01/05/2020: ALT 26; BUN 9; Creatinine, Ser 0.79; Hemoglobin 15.0; Platelets 258.0; Potassium 5.0; Sodium 140; TSH 0.85   Recent Lipid Panel Lab Results  Component Value Date/Time   CHOL 172 01/05/2020 10:17 AM   TRIG 94.0 01/05/2020 10:17 AM   HDL 51.60 01/05/2020 10:17 AM   CHOLHDL 3 01/05/2020 10:17 AM   LDLCALC 102 (H) 01/05/2020 10:17 AM    Wt Readings from Last 3 Encounters:  08/27/20 (!) 307 lb (139.3 kg)  05/31/20 (!) 315 lb 9.6 oz (143.2 kg)  03/04/20 (!) 320 lb (145.2 kg)      Risk Assessment/Calculations:      Objective:    Vital Signs:  BP 124/82    Pulse 62    Ht 5\' 9"  (1.753 m)    Wt (!) 307 lb (139.3 kg)    LMP 01/04/2010    BMI 45.34 kg/m    VITAL SIGNS:  reviewed GEN:  no acute distress EYES:  sclerae anicteric, EOMI - Extraocular Movements Intact RESPIRATORY:  normal respiratory effort, symmetric expansion CARDIOVASCULAR:  no peripheral edema SKIN:  no rash, lesions or ulcers. MUSCULOSKELETAL:  no obvious deformities. NEURO:  alert and oriented x 3, no obvious focal deficit PSYCH:  normal affect  ASSESSMENT & PLAN:    1. PVCs (premature ventricular contractions)   2. Palpitations   3. Chest pain, unspecified type   4. Hypertension, unspecified type   5. Medication management    PVCs - continue metoprolol 25 mg BID. Can use prn metoprolol for breakthrough palpitations or exercise related palpitations.  Chest pain - no recurrence.  HTN - continue metoprolol and lisinopril. BP has been well controlled.  Dilated LV chamber size - noted on prior echo. Will follow up with echo electively in the summer given minimal symptoms.   COVID-19 Education: The signs and symptoms of COVID-19 were discussed with the patient and how to seek care for testing (follow up with PCP or arrange E-visit).  The importance of social distancing was discussed today.  Time:   Today, I have spent 20 minutes with the patient with telehealth technology discussing the above problems.     Medication Adjustments/Labs and Tests Ordered: Current medicines are reviewed at length with the patient today.  Concerns regarding medicines are outlined above.   Patient Instructions  Medication Instructions:  No Changes In Medications at this time.  *If you need a refill on your cardiac medications before your next appointment, please call your pharmacy*  Testing/Procedures: Your physician has requested that you have an echocardiogram in 6 months. Echocardiography is a  painless test that uses sound waves to create images of your heart. It provides your doctor with information about the size and shape of your heart and how well your hearts chambers and valves are working. You may receive an ultrasound enhancing agent through an IV if needed to better visualize your heart during the echo.This procedure takes approximately one hour. There are no restrictions for this procedure. This will take place at the [address], [address].   Follow-Up: At [facility], you and your health needs are our priority.  As part of our continuing mission to provide you with exceptional heart care, we have created designated Provider Care Teams.  These Care Teams include your primary Cardiologist (physician) and Advanced Practice Providers (APPs -  Physician Assistants and Nurse Practitioners) who all work together to provide you with the care you need, when you need it.  Your next appointment:   PLEASE FOLLOW UP AFTER ECHO- 6 months   The format for your next appointment:   In Person  Provider:   Weston Brass, MD   Signed, Parke Poisson, MD  08/27/2020 8:24 AM    [facility]

## 2020-08-27 NOTE — Patient Instructions (Addendum)
Medication Instructions:  No Changes In Medications at this time.  *If you need a refill on your cardiac medications before your next appointment, please call your pharmacy*  Testing/Procedures: Your physician has requested that you have an echocardiogram in 6 months. Echocardiography is a painless test that uses sound waves to create images of your heart. It provides your doctor with information about the size and shape of your heart and how well your heart's chambers and valves are working. You may receive an ultrasound enhancing agent through an IV if needed to better visualize your heart during the echo.This procedure takes approximately one hour. There are no restrictions for this procedure. This will take place at the [address], [address].   Follow-Up: At [facility], you and your health needs are our priority.  As part of our continuing mission to provide you with exceptional heart care, we have created designated Provider Care Teams.  These Care Teams include your primary Cardiologist (physician) and Advanced Practice Providers (APPs -  Physician Assistants and Nurse Practitioners) who all work together to provide you with the care you need, when you need it.  Your next appointment:   PLEASE FOLLOW UP AFTER ECHO- 6 months   The format for your next appointment:   In Person  Provider:   Weston Brass, MD

## 2020-10-19 ENCOUNTER — Other Ambulatory Visit: Payer: Self-pay | Admitting: Physician Assistant

## 2021-01-03 ENCOUNTER — Encounter: Payer: Self-pay | Admitting: Physician Assistant

## 2021-01-03 ENCOUNTER — Other Ambulatory Visit: Payer: Self-pay | Admitting: Physician Assistant

## 2021-01-04 ENCOUNTER — Telehealth: Payer: Self-pay | Admitting: Hematology and Oncology

## 2021-01-04 NOTE — Telephone Encounter (Signed)
Received a call from Ms. Connie Mosley who is wanting to transfer her care from [facility] for autoimmune hemolytic anemia. Ms. Connie Mosley has been cld and scheduled to see Dr. Leonides Schanz on 4/28 at 230pm. Pt aware to arrive 20 minutes early.

## 2021-01-05 ENCOUNTER — Telehealth: Payer: Self-pay | Admitting: Hematology and Oncology

## 2021-01-05 NOTE — Telephone Encounter (Signed)
Called and spoke with patient to r/s 4/28 appt due to provider family emergency. Confirmed new date and time.

## 2021-01-06 ENCOUNTER — Inpatient Hospital Stay: Payer: Medicare Other | Admitting: Hematology and Oncology

## 2021-01-06 ENCOUNTER — Inpatient Hospital Stay: Payer: Medicare Other

## 2021-01-19 ENCOUNTER — Inpatient Hospital Stay: Payer: Medicare Other

## 2021-01-19 ENCOUNTER — Encounter: Payer: Self-pay | Admitting: Hematology and Oncology

## 2021-01-19 ENCOUNTER — Other Ambulatory Visit: Payer: Self-pay

## 2021-01-19 ENCOUNTER — Encounter: Payer: Medicare Other | Admitting: Physician Assistant

## 2021-01-19 ENCOUNTER — Inpatient Hospital Stay: Payer: Medicare Other | Attending: Hematology and Oncology | Admitting: Hematology and Oncology

## 2021-01-19 VITALS — BP 161/77 | HR 66 | Temp 97.8°F | Resp 20 | Ht 69.0 in | Wt 313.9 lb

## 2021-01-19 DIAGNOSIS — D5919 Other autoimmune hemolytic anemia: Secondary | ICD-10-CM

## 2021-01-19 DIAGNOSIS — E538 Deficiency of other specified B group vitamins: Secondary | ICD-10-CM | POA: Insufficient documentation

## 2021-01-19 DIAGNOSIS — D591 Autoimmune hemolytic anemia, unspecified: Secondary | ICD-10-CM | POA: Diagnosis not present

## 2021-01-19 LAB — CBC WITH DIFFERENTIAL (CANCER CENTER ONLY)
Abs Immature Granulocytes: 0.02 10*3/uL (ref 0.00–0.07)
Basophils Absolute: 0.1 10*3/uL (ref 0.0–0.1)
Basophils Relative: 1 %
Eosinophils Absolute: 0.1 10*3/uL (ref 0.0–0.5)
Eosinophils Relative: 1 %
HCT: 47.2 % — ABNORMAL HIGH (ref 36.0–46.0)
Hemoglobin: 15.1 g/dL — ABNORMAL HIGH (ref 12.0–15.0)
Immature Granulocytes: 0 %
Lymphocytes Relative: 39 %
Lymphs Abs: 4.3 10*3/uL — ABNORMAL HIGH (ref 0.7–4.0)
MCH: 29.4 pg (ref 26.0–34.0)
MCHC: 32 g/dL (ref 30.0–36.0)
MCV: 92 fL (ref 80.0–100.0)
Monocytes Absolute: 0.9 10*3/uL (ref 0.1–1.0)
Monocytes Relative: 8 %
Neutro Abs: 5.8 10*3/uL (ref 1.7–7.7)
Neutrophils Relative %: 51 %
Platelet Count: 255 10*3/uL (ref 150–400)
RBC: 5.13 MIL/uL — ABNORMAL HIGH (ref 3.87–5.11)
RDW: 15.2 % (ref 11.5–15.5)
WBC Count: 11.2 10*3/uL — ABNORMAL HIGH (ref 4.0–10.5)
nRBC: 0 % (ref 0.0–0.2)

## 2021-01-19 LAB — CMP (CANCER CENTER ONLY)
ALT: 17 U/L (ref 0–44)
AST: 17 U/L (ref 15–41)
Albumin: 3.7 g/dL (ref 3.5–5.0)
Alkaline Phosphatase: 72 U/L (ref 38–126)
Anion gap: 8 (ref 5–15)
BUN: 15 mg/dL (ref 8–23)
CO2: 26 mmol/L (ref 22–32)
Calcium: 9.3 mg/dL (ref 8.9–10.3)
Chloride: 105 mmol/L (ref 98–111)
Creatinine: 0.85 mg/dL (ref 0.44–1.00)
GFR, Estimated: >60 mL/min (ref >60)
Glucose, Bld: 106 mg/dL — ABNORMAL HIGH (ref 70–99)
Potassium: 4.5 mmol/L (ref 3.5–5.1)
Sodium: 139 mmol/L (ref 135–145)
Total Bilirubin: 0.6 mg/dL (ref 0.3–1.2)
Total Protein: 6.8 g/dL (ref 6.5–8.1)

## 2021-01-19 LAB — RETICULOCYTES
Immature Retic Fract: 3.9 % (ref 2.3–15.9)
RBC.: 5.11 MIL/uL (ref 3.87–5.11)
Retic Count, Absolute: 65.9 10*3/uL (ref 19.0–186.0)
Retic Ct Pct: 1.3 % (ref 0.4–3.1)

## 2021-01-19 LAB — FOLATE: Folate: 44.2 ng/mL (ref >5.9)

## 2021-01-19 LAB — VITAMIN B12: Vitamin B-12: 611 pg/mL (ref 180–914)

## 2021-01-19 LAB — LACTATE DEHYDROGENASE: LDH: 199 U/L — ABNORMAL HIGH (ref 98–192)

## 2021-01-19 MED ORDER — FOLIC ACID 1 MG PO TABS: 1.0000 mg | ORAL_TABLET | Freq: Every day | ORAL | 5 refills | Status: DC

## 2021-01-19 MED ORDER — CYANOCOBALAMIN 1000 MCG/ML IJ SOLN: 1000.0000 ug | INTRAMUSCULAR | 0 refills | Status: DC

## 2021-01-19 NOTE — Progress Notes (Signed)
[facility] Telephone:([phone]   [phone]) [phone]  INITIAL CONSULT NOTE  Patient Care Team: Jarold Motto, [place] as PCP - General (Physician Assistant) Parke Poisson, MD as PCP - Cardiology (Cardiology) Donia Guiles, MD as Referring Physician (Hematology and Oncology)  Hematological/Oncological History # Autoimmune Hemolytic Anemia 08/16/2020: last visit with Dr. Donia Guiles at [facility] 01/19/2021: establish care with Dr. Leonides Schanz  CHIEF COMPLAINTS/PURPOSE OF CONSULTATION:  "Hemolytic Anemia "  HISTORY OF PRESENTING ILLNESS:  Connie Mosley 60 y.o. female with medical history significant for autoimmune hemolytic anemia who presents to establish care.   On review of the previous records Connie Mosley was last seen by Dr. Donia Guiles on 08/16/2020. He summarized her history as:  " She has history of autoimmune hemolytic anemia, which was diagnosed in 2012, when she developed fatigue, pallor, jaundice, dark urine and shortness of breath, while living in [place]. She was diagnosed with autoimmune hemolytic anemia (hemoglobin 6 g/dL). She was initially unsuccessfully treated with prednisone, followed by 5 infusions of rituximab (also without improvement of hemoglobin). 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 Even after that her hemoglobin remained low around 8 g/dL chronically. She notes that prior to splenectomy, she underwent all the appropriate vaccinations. She then received additional course of prednisone, with a very slow taper with response, ultimately discontinued in 2017 with effectively normal hemolobin since"  On exam today Connie Mosley reports she feels quite well.  Her energy is good and she is not having any fatigue, shortness of breath, or dark urine.  She notes that she is currently not having any fevers, chills, sweats, nausea, vomiting or diarrhea.  She did want to discuss COVID  vaccines and Shingrix vaccines today.  I did encourage her to pursue the Shingrix vaccine, but noted that she has had the 2 Moderna shots as well as 1 booster and does not necessarily require the fourth booster shot (we are not routinely recommending that shot at this time).  A full 10 point ROS is listed below.  On further discussion she notes that the initial symptoms she had with her hemolytic anemia were massive amounts of fatigue and some slight shortness of breath.  She did note darkness of her urine at that time.  She is currently managed on a vitamin B12 shots every 2 weeks IM as well as folic acid 1 mg daily.  She would like [place] to fill these medications today which I will happily oblige.  MEDICAL HISTORY:  Past Medical History:  Diagnosis Date  . Hypertension 09/11/2008  . Idiopathic autoimmune hemolytic anemia (HCC) 2010   rx included in past prednisone, retuximab, splenectomy  . Irritable bowel syndrome 05/12/2012    SURGICAL HISTORY: Past Surgical History:  Procedure Laterality Date  . APPENDECTOMY  1982  . 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 TONSILECTOMY/ADENOIDECTOMY WITH MYRINGOTOMY     childhood   . total knee Left 2012    SOCIAL HISTORY: Social History   Socioeconomic History  . Marital status: Married    Spouse name: Not on file  . Number of children: Not on file  . Years of education: Not on file  . Highest education level: Not on file  Occupational History  . Occupation: Retired   Tobacco Use  . Smoking status: Never Smoker  . Smokeless tobacco: Never Used  Substance and Sexual Activity  . Alcohol use: Yes    Comment: wine 2 x a week  . Drug  use: Never  . Sexual activity: Not on file  Other Topics Concern  . 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  MEDICATIONS:  Current Outpatient Medications  Medication Sig Dispense Refill  . cyanocobalamin (,VITAMIN B-12,) 1000 MCG/ML injection Inject into the muscle.    . famotidine (PEPCID) 40 MG tablet TAKE 1 TABLET BY MOUTH EVERY DAY 90 tablet 1  . folic acid (FOLVITE) 1 MG tablet Take by mouth.    Marland Kitchen lisinopril (ZESTRIL) 20 MG tablet TAKE 1 TABLET BY MOUTH EVERY DAY 30 tablet 0  . metoprolol tartrate (LOPRESSOR) 25 MG tablet Take 1 tablet (25 mg total) by mouth 2 (two) times daily. May take Additional 25 mg(1tablet) as needed prior to exercise (within 1 hour) 90 tablet 4   No current facility-administered medications for this visit.    REVIEW OF SYSTEMS:   Constitutional: ( - ) fevers, ( - )  chills , ( - ) night sweats Eyes: ( - ) blurriness of vision, ( - ) double vision, ( - ) watery eyes Ears, nose, mouth, throat, and face: ( - ) mucositis, ( - ) sore throat Respiratory: ( - ) cough, ( - ) dyspnea, ( - ) wheezes Cardiovascular: ( - ) palpitation, ( - ) chest discomfort, ( - ) lower extremity swelling Gastrointestinal:  ( - ) nausea, ( - ) heartburn, ( - ) change in bowel habits Skin: ( - ) abnormal skin rashes Lymphatics: ( - ) new lymphadenopathy, ( - ) easy bruising Neurological: ( - ) numbness, ( - ) tingling, ( - ) new weaknesses Behavioral/Psych: ( - ) mood change, ( - ) new changes  All other systems were reviewed with the patient and are negative.  PHYSICAL EXAMINATION:  Vitals:   01/19/21 1417  BP: (!) 161/77  Pulse: 66  Resp: 20  Temp: 97.8 F (36.6 C)  SpO2: 98%   Filed Weights   01/19/21 1417  Weight: (!) 313 lb 14.4 oz (142.4 kg)    GENERAL:  well appearing middle aged Caucasian female in NAD  SKIN: skin color, texture, turgor are normal, no rashes or significant lesions EYES: conjunctiva are pink and non-injected, sclera clear LUNGS: clear to auscultation and percussion with normal breathing effort HEART: regular rate & rhythm and no murmurs and no lower extremity edema Musculoskeletal: no cyanosis of digits and no clubbing  PSYCH: alert & oriented x 3, fluent speech NEURO: no focal motor/sensory deficits  LABORATORY DATA:  I have reviewed the data as listed CBC Latest Ref Rng & Units 01/05/2020  WBC 4.0 - 10.5 K/uL 9.6  Hemoglobin 12.0 - 15.0 g/dL 10.9  Hematocrit 60.4 - 46.0 % 45.6  Platelets 150.0 - 400.0 K/uL 258.0    CMP Latest Ref Rng & Units 01/05/2020  Glucose 70 - 99 mg/dL 540(J)  BUN 6 - 23 mg/dL 9  Creatinine 8.11 - 9.14 mg/dL  0.79  Sodium 135 - 145 mEq/L 140  Potassium 3.5 - 5.1 mEq/L 5.0  Chloride 96 - 112 mEq/L 104  CO2 19 - 32 mEq/L 28  Calcium 8.4 - 10.5 mg/dL 9.6  Total Protein 6.0 - 8.3 g/dL 6.9  Total Bilirubin 0.2 - 1.2 mg/dL 1.1  Alkaline Phos 39 - 117 U/L 67  AST 0 - 37 U/L 27  ALT 0 - 35 U/L 26    RADIOGRAPHIC STUDIES: No results found.  ASSESSMENT & PLAN Connie Mosley 60 y.o. female with medical history significant for autoimmune hemolytic anemia who presents to establish care.   After review of the labs, review of the records, and discussion with the patient, the findings are most consistent with a well-controlled autoimmune hemolytic anemia.  She has been stable since 2017.  She was last seen at [facility] in December 2021 at which time her history was summarized as having undergone rituximab, cyclophosphamide, splenectomy, and finally a long steroid taper which put her into a durable remission in 2017.  She presents today because she would like to have a local hematologist as a go to in the event there were further issues.  Today I discussed with her that given  her stable hemoglobin I be happy to see her once a year for check at and on an as-needed basis if she were to develop any new or worsening symptoms.  She voiced understanding of this plan moving forward and noted she would be vigilant for the symptoms that would imply she is having an episode of hemolytic anemia.  # Autoimmune Hemolytic Anemia --today will order new baseline labs to include CBC, CMP, LDH, haptoglobin -- Based on prior labs and records the patient has had stable hemoglobin since 2017. --No indication for treatment at this time, we will continue with observation. --Strict return precautions for patient if she were to notice darkening of her urine, fatigue, or other concerning signs or symptoms. --RTC in 1 years time or sooner if indicated by labs/symptoms.  #Vitamin B12 Deficiency #Folic Acid supplementation -- Patient is currently on vitamin B12 and folic acid for her hemolytic anemia.  Today I will refill her folic acid 1 mg p.o. daily as well as her vitamin B12 1000 mcg subcu every 2 weeks.  No orders of the defined types were placed in this encounter.   All questions were answered. The patient knows to call the clinic with any problems, questions or concerns.  A total of more than 60 minutes were spent on this encounter and over half of that time was spent on counseling and coordination of care as outlined above.   Ulysees Barns, MD Department of Hematology/Oncology [facility] at [facility] Phone: [phone] Pager: [phone] Email: [email or web address]  01/19/2021 2:23 PM

## 2021-01-20 ENCOUNTER — Telehealth: Payer: Self-pay | Admitting: Hematology and Oncology

## 2021-01-20 LAB — HAPTOGLOBIN: Haptoglobin: 66 mg/dL (ref 37–355)

## 2021-01-20 NOTE — Telephone Encounter (Signed)
Scheduled per los. Mailed printout  °

## 2021-01-26 ENCOUNTER — Encounter: Payer: Medicare Other | Admitting: Physician Assistant

## 2021-01-27 ENCOUNTER — Other Ambulatory Visit: Payer: Self-pay

## 2021-01-27 ENCOUNTER — Ambulatory Visit (INDEPENDENT_AMBULATORY_CARE_PROVIDER_SITE_OTHER): Payer: Medicare Other | Admitting: Family

## 2021-01-27 ENCOUNTER — Encounter: Payer: Self-pay | Admitting: Family

## 2021-01-27 ENCOUNTER — Other Ambulatory Visit (HOSPITAL_COMMUNITY)
Admission: RE | Admit: 2021-01-27 | Discharge: 2021-01-27 | Disposition: A | Discharge status: Home / Self Care | Payer: Medicare Other | Source: Ambulatory Visit | Attending: Physician Assistant | Admitting: Physician Assistant

## 2021-01-27 VITALS — BP 126/75 | HR 75 | Temp 98.2°F | Ht 69.0 in | Wt 308.6 lb

## 2021-01-27 DIAGNOSIS — Z1231 Encounter for screening mammogram for malignant neoplasm of breast: Secondary | ICD-10-CM

## 2021-01-27 DIAGNOSIS — Z13220 Encounter for screening for lipoid disorders: Secondary | ICD-10-CM | POA: Diagnosis not present

## 2021-01-27 DIAGNOSIS — Z124 Encounter for screening for malignant neoplasm of cervix: Secondary | ICD-10-CM | POA: Diagnosis not present

## 2021-01-27 DIAGNOSIS — I10 Essential (primary) hypertension: Secondary | ICD-10-CM | POA: Diagnosis not present

## 2021-01-27 DIAGNOSIS — Z136 Encounter for screening for cardiovascular disorders: Secondary | ICD-10-CM | POA: Diagnosis not present

## 2021-01-27 DIAGNOSIS — Z0000 Encounter for general adult medical examination without abnormal findings: Secondary | ICD-10-CM

## 2021-01-27 DIAGNOSIS — E8881 Metabolic syndrome: Secondary | ICD-10-CM | POA: Diagnosis not present

## 2021-01-27 LAB — POCT URINALYSIS DIPSTICK
Bilirubin, UA: NEGATIVE
Blood, UA: NEGATIVE
Glucose, UA: NEGATIVE
Ketones, UA: NEGATIVE
Leukocytes, UA: NEGATIVE
Nitrite, UA: NEGATIVE
Protein, UA: NEGATIVE
Spec Grav, UA: 1.02 (ref 1.010–1.025)
Urobilinogen, UA: 0.2 E.U./dL
pH, UA: 6 (ref 5.0–8.0)

## 2021-01-27 LAB — LIPID PANEL
Cholesterol: 170 mg/dL (ref 0–200)
HDL: 56.9 mg/dL (ref >39.00)
LDL Cholesterol: 80 mg/dL (ref 0–99)
NonHDL: 113.31
Total CHOL/HDL Ratio: 3
Triglycerides: 167 mg/dL — ABNORMAL HIGH (ref 0.0–149.0)
VLDL: 33.4 mg/dL (ref 0.0–40.0)

## 2021-01-27 LAB — HEMOGLOBIN A1C: Hgb A1c MFr Bld: 6.3 % (ref 4.6–6.5)

## 2021-01-27 MED ORDER — FAMOTIDINE 40 MG PO TABS: 40.0000 mg | ORAL_TABLET | Freq: Every day | ORAL | 1 refills | Status: DC

## 2021-01-27 MED ORDER — LISINOPRIL 20 MG PO TABS: 1.0000 | ORAL_TABLET | Freq: Every day | ORAL | 1 refills | Status: DC

## 2021-01-27 MED ORDER — METOPROLOL TARTRATE 25 MG PO TABS: 25.0000 mg | ORAL_TABLET | Freq: Two times a day (BID) | ORAL | 1 refills | Status: DC

## 2021-01-27 NOTE — Progress Notes (Signed)
Established Patient Office Visit  Subjective:  Patient ID: Connie Mosley, female    DOB: [birth year]  Age: 60 y.o. MRN: [id number]  CC:  Chief Complaint  Patient presents with  . Annual Exam    Not fasting. Needs refills  . Gynecologic Exam    Due for pap    HPI Connie Mosley presents for a CPX with a pap smear and pelvic exam. She has a history of HTN, obesity, Insulin resistance, and IBS. Denies any concerns. She is due for a mammogram. Colonoscopy is UTD and due in 2029. Influenza vaccine UTD.  Past Medical History:  Diagnosis Date  . Hypertension 09/11/2008  . Idiopathic autoimmune hemolytic anemia (HCC) 2010   rx included in past prednisone, retuximab, splenectomy  . Irritable bowel syndrome 05/12/2012    Past Surgical History:  Procedure Laterality Date  . APPENDECTOMY  1982  . 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 TONSILECTOMY/ADENOIDECTOMY WITH MYRINGOTOMY     childhood   . total knee Left 2012    Family History  Adopted: Yes    Social History   Socioeconomic History  . Marital status: Married    Spouse name: Not on file  . Number of children: Not on file  . Years of education: Not on file  . Highest education level: Not on file  Occupational History  . Occupation: Retired   Tobacco Use  . Smoking status: Never Smoker  . Smokeless tobacco: Never Used  Substance and Sexual Activity  . Alcohol use: Yes    Comment: wine 2 x a week  . Drug use: Never  . Sexual activity: Not on file  Other Topics Concern  . 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 cyanocobalamin (,VITAMIN B-12,) 1000 MCG/ML injection Inject 1 mL (1,000 mcg total) into the muscle every 14 (fourteen) days. 25 mL 0  . famotidine (PEPCID) 40 MG tablet TAKE 1 TABLET BY MOUTH EVERY DAY 90 tablet 1  . folic acid (FOLVITE) 1 MG tablet Take 1 tablet (1 mg total) by mouth daily. 90 tablet 5  . lisinopril (ZESTRIL) 20 MG tablet TAKE 1 TABLET BY MOUTH EVERY DAY 30 tablet 0  . metoprolol tartrate (LOPRESSOR) 25 MG tablet Take 1 tablet (25 mg total) by mouth 2 (two) times daily. May take Additional 25 mg(1tablet) as needed prior to exercise (within 1 hour) 90 tablet 4   No facility-administered medications prior to visit.    Allergies  Allergen Reactions  . Cefuroxime Axetil Itching  . Sulfamethoxazole-Trimethoprim Other (See Comments)    Reacts with Bone Marrow per Hemotologist   . Cefuroxime Hives and Rash    ROS Review of Systems  Constitutional: Negative.   Eyes: Negative.   Respiratory: Negative.   Cardiovascular: Negative.   Musculoskeletal: Negative.   Skin: Negative.   Psychiatric/Behavioral: Negative.   All other systems reviewed and are negative.     Objective:    Physical Exam Vitals reviewed.  Constitutional:  Appearance: Normal appearance. She is obese.  HENT:     Head: Normocephalic and atraumatic.     Right Ear: Tympanic membrane, ear canal and external ear normal.     Left Ear: Tympanic membrane, ear canal and external ear normal.     Nose: Nose normal.  Eyes:     Extraocular Movements: Extraocular movements intact.     Conjunctiva/sclera: Conjunctivae normal.     Pupils: Pupils are equal, round, and reactive to light.  Cardiovascular:     Rate and Rhythm: Normal rate and regular rhythm.     Pulses: Normal pulses.     Heart sounds: Normal heart sounds.  Pulmonary:     Effort: Pulmonary effort is normal.     Breath sounds: Normal breath sounds.  Abdominal:     General: Abdomen is flat.  Bowel sounds are normal.     Palpations: Abdomen is soft.  Genitourinary:    General: Normal vulva.     Vagina: No vaginal discharge.     Rectum: Normal.  Musculoskeletal:        General: Normal range of motion.     Cervical back: Normal range of motion and neck supple.  Skin:    General: Skin is warm and dry.  Neurological:     General: No focal deficit present.     Mental Status: She is alert and oriented to person, place, and time.  Psychiatric:        Mood and Affect: Mood normal.        Behavior: Behavior normal.     BP 126/75   Pulse 75   Temp 98.2 F (36.8 C) (Temporal)   Ht 5\' 9"  (1.753 m)   Wt (!) 308 lb 9.6 oz (140 kg)   LMP 01/04/2010   SpO2 96%   BMI 45.57 kg/m  Wt Readings from Last 3 Encounters:  01/27/21 (!) 308 lb 9.6 oz (140 kg)  01/19/21 (!) 313 lb 14.4 oz (142.4 kg)  08/27/20 (!) 307 lb (139.3 kg)     Health Maintenance Due  Topic Date Due  . HIV Screening  Never done  . Hepatitis C Screening  Never done  . PAP SMEAR-Modifier  Never done  . MAMMOGRAM  Never done  . COVID-19 Vaccine (3 - Booster for Moderna series) 05/28/2020    There are no preventive care reminders to display for this patient.  Lab Results  Component Value Date   TSH 0.85 01/05/2020   Lab Results  Component Value Date   WBC 11.2 (H) 01/19/2021   HGB 15.1 (H) 01/19/2021   HCT 47.2 (H) 01/19/2021   MCV 92.0 01/19/2021   PLT 255 01/19/2021   Lab Results  Component Value Date   NA 139 01/19/2021   K 4.5 01/19/2021   CO2 26 01/19/2021   GLUCOSE 106 (H) 01/19/2021   BUN 15 01/19/2021   CREATININE 0.85 01/19/2021   BILITOT 0.6 01/19/2021   ALKPHOS 72 01/19/2021   AST 17 01/19/2021   ALT 17 01/19/2021   PROT 6.8 01/19/2021   ALBUMIN 3.7 01/19/2021   CALCIUM 9.3 01/19/2021   ANIONGAP 8 01/19/2021   GFR 73.93 01/05/2020   Lab Results  Component Value Date   CHOL 172 01/05/2020   Lab Results  Component Value Date   HDL 51.60 01/05/2020   Lab Results   Component Value Date   LDLCALC 102 (H) 01/05/2020   Lab Results  Component Value Date   TRIG 94.0 01/05/2020   Lab Results  Component Value Date   CHOLHDL 3 01/05/2020   Lab Results  Component Value Date   HGBA1C 6.1 01/05/2020      Assessment & Plan:   Problem List Items Addressed This Visit    Morbid obesity due to excess calories (HCC)   Relevant Orders   POCT HgB A1C   Insulin resistance   Relevant Orders   POCT HgB A1C   Hypertension   Relevant Orders   Lipid Panel   POC Urinalysis Dipstick    Other Visit Diagnoses    Encounter for Medicare annual wellness exam    -  Primary   Relevant Orders   POC Urinalysis Dipstick   Encounter for lipid screening for cardiovascular disease       Relevant Orders   Lipid Panel   POC Urinalysis Dipstick   Screening for malignant neoplasm of cervix       Relevant Orders   Cytology - PAP( [place])   POC Urinalysis Dipstick   Screening mammogram for breast cancer       Relevant Orders   MM Digital Screening      Patient to get Shingrix from [facility] on Friday. Labs obtained today. Follow-up in 6 months and sooner as needed.   Follow-up: No follow-ups on file.    Eulis Foster, FNP

## 2021-01-27 NOTE — Patient Instructions (Signed)
Ms. Connie Mosley , Thank you for taking time to come for your Medicare Wellness Visit. I appreciate your ongoing commitment to your health goals. Please review the following plan we discussed and let me know if I can assist you in the future.   These are the goals we discussed: Goals   None     This is a list of the screening recommended for you and due dates:  Health Maintenance  Topic Date Due  . HIV Screening  Never done  . Hepatitis C Screening: USPSTF Recommendation to screen - Ages 38-79 yo.  Never done  . Pap Smear  Never done  . Mammogram  Never done  . COVID-19 Vaccine (3 - Booster for Moderna series) 05/28/2020  . Flu Shot  04/11/2021  . Tetanus Vaccine  02/28/2026  . Colon Cancer Screening  08/11/2028  . HPV Vaccine  Aged Out  Preventive Care 62-71 Years Old, Female Preventive care refers to lifestyle choices and visits with your health care provider that can promote health and wellness. This includes:  A yearly physical exam. This is also called an annual wellness visit.  Regular dental and eye exams.  Immunizations.  Screening for certain conditions.  Healthy lifestyle choices, such as: ? Eating a healthy diet. ? Getting regular exercise. ? Not using drugs or products that contain nicotine and tobacco. ? Limiting alcohol use. What can I expect for my preventive care visit? Physical exam Your health care provider will check your:  Height and weight. These may be used to calculate your BMI (body mass index). BMI is a measurement that tells if you are at a healthy weight.  Heart rate and blood pressure.  Body temperature.  Skin for abnormal spots. Counseling Your health care provider may ask you questions about your:  Past medical problems.  Family's medical history.  Alcohol, tobacco, and drug use.  Emotional well-being.  Home life and relationship well-being.  Sexual activity.  Diet, exercise, and sleep habits.  Work and work  Statistician.  Access to firearms.  Method of birth control.  Menstrual cycle.  Pregnancy history. What immunizations do I need? Vaccines are usually given at various ages, according to a schedule. Your health care provider will recommend vaccines for you based on your age, medical history, and lifestyle or other factors, such as travel or where you work.   What tests do I need? Blood tests  Lipid and cholesterol levels. These may be checked every 5 years, or more often if you are over 62 years old.  Hepatitis C test.  Hepatitis B test. Screening  Lung cancer screening. You may have this screening every year starting at age 62 if you have a 30-pack-year history of smoking and currently smoke or have quit within the past 15 years.  Colorectal cancer screening. ? All adults should have this screening starting at age 9 and continuing until age 29. ? Your health care provider may recommend screening at age 50 if you are at increased risk. ? You will have tests every 1-10 years, depending on your results and the type of screening test.  Diabetes screening. ? This is done by checking your blood sugar (glucose) after you have not eaten for a while (fasting). ? You may have this done every 1-3 years.  Mammogram. ? This may be done every 1-2 years. ? Talk with your health care provider about when you should start having regular mammograms. This may depend on whether you have a family history of breast  cancer.  BRCA-related cancer screening. This may be done if you have a family history of breast, ovarian, tubal, or peritoneal cancers.  Pelvic exam and Pap test. ? This may be done every 3 years starting at age 62. ? Starting at age 51, this may be done every 5 years if you have a Pap test in combination with an HPV test. Other tests  STD (sexually transmitted disease) testing, if you are at risk.  Bone density scan. This is done to screen for osteoporosis. You may have this scan if  you are at high risk for osteoporosis. Talk with your health care provider about your test results, treatment options, and if necessary, the need for more tests. Follow these instructions at home: Eating and drinking  Eat a diet that includes fresh fruits and vegetables, whole grains, lean protein, and low-fat dairy products.  Take vitamin and mineral supplements as recommended by your health care provider.  Do not drink alcohol if: ? Your health care provider tells you not to drink. ? You are pregnant, may be pregnant, or are planning to become pregnant.  If you drink alcohol: ? Limit how much you have to 0-1 drink a day. ? Be aware of how much alcohol is in your drink. In the [place] one drink equals one 12 oz bottle of beer (355 mL), one 5 oz glass of wine (148 mL), or one 1 oz glass of hard liquor (44 mL).   Lifestyle  Take daily care of your teeth and gums. Brush your teeth every morning and night with fluoride toothpaste. Floss one time each day.  Stay active. Exercise for at least 30 minutes 5 or more days each week.  Do not use any products that contain nicotine or tobacco, such as cigarettes, e-cigarettes, and chewing tobacco. If you need help quitting, ask your health care provider.  Do not use drugs.  If you are sexually active, practice safe sex. Use a condom or other form of protection to prevent STIs (sexually transmitted infections).  If you do not wish to become pregnant, use a form of birth control. If you plan to become pregnant, see your health care provider for a prepregnancy visit.  If told by your health care provider, take low-dose aspirin daily starting at age 24.  Find healthy ways to cope with stress, such as: ? Meditation, yoga, or listening to music. ? Journaling. ? Talking to a trusted person. ? Spending time with friends and family. Safety  Always wear your seat belt while driving or riding in a vehicle.  Do not drive: ? If you have been drinking  alcohol. Do not ride with someone who has been drinking. ? When you are tired or distracted. ? While texting.  Wear a helmet and other protective equipment during sports activities.  If you have firearms in your house, make sure you follow all gun safety procedures. What's next?  Visit your health care provider once a year for an annual wellness visit.  Ask your health care provider how often you should have your eyes and teeth checked.  Stay up to date on all vaccines. This information is not intended to replace advice given to you by your health care provider. Make sure you discuss any questions you have with your health care provider. Document Revised: 06/01/2020 Document Reviewed: 05/09/2018 Elsevier Patient Education  2021 [employer].

## 2021-01-28 LAB — CYTOLOGY - PAP: Diagnosis: NEGATIVE

## 2021-02-03 ENCOUNTER — Ambulatory Visit (INDEPENDENT_AMBULATORY_CARE_PROVIDER_SITE_OTHER): Payer: Medicare Other

## 2021-02-03 DIAGNOSIS — Z0000 Encounter for general adult medical examination without abnormal findings: Secondary | ICD-10-CM

## 2021-02-03 NOTE — Progress Notes (Signed)
Virtual Visit via Telephone Note  I connected with  Ginnifer Winne on 02/03/21 at  1:45 PM EDT by telephone and verified that I am speaking with the correct person using two identifiers.  Medicare Annual Wellness visit completed telephonically due to Covid-19 pandemic.   Persons participating in this call: This Health Coach and this patient.   Location: Patient: Home Provider: Office   I discussed the limitations, risks, security and privacy concerns of performing an evaluation and management service by telephone and the availability of in person appointments. The patient expressed understanding and agreed to proceed.  Unable to perform video visit due to video visit attempted and failed and/or patient does not have video capability.   Some vital signs may be absent or patient reported.   Marzella Schlein, LPN    Subjective:   Adamarys Shall is a 60 y.o. female who presents for Medicare Annual (Subsequent) preventive examination.  Review of Systems     Cardiac Risk Factors include: obesity (BMI >30kg/m2);hypertension     Objective:    There were no vitals filed for this visit. There is no height or weight on file to calculate BMI.  Advanced Directives 02/03/2021 01/05/2020  Does Patient Have a Medical Advance Directive? Yes No  Type of Advance Directive Healthcare Power of Attorney -  Would patient like information on creating a medical advance directive? - Yes (MAU/Ambulatory/Procedural Areas - Information given)    Current Medications (verified) Outpatient Encounter Medications as of 02/03/2021  Medication Sig  . cyanocobalamin (,VITAMIN B-12,) 1000 MCG/ML injection Inject 1 mL (1,000 mcg total) into the muscle every 14 (fourteen) days.  . famotidine (PEPCID) 40 MG tablet Take 1 tablet (40 mg total) by mouth daily.  . folic acid (FOLVITE) 1 MG tablet Take 1 tablet (1 mg total) by mouth daily.  Marland Kitchen lisinopril (ZESTRIL) 20 MG tablet Take 1 tablet (20 mg total) by mouth daily.   . metoprolol tartrate (LOPRESSOR) 25 MG tablet Take 1 tablet (25 mg total) by mouth 2 (two) times daily. May take Additional 25 mg(1tablet) as needed prior to exercise (within 1 hour)   No facility-administered encounter medications on file as of 02/03/2021.    Allergies (verified) Cefuroxime axetil, Sulfamethoxazole-trimethoprim, and Cefuroxime   History: Past Medical History:  Diagnosis Date  . Hypertension 09/11/2008  . Idiopathic autoimmune hemolytic anemia (HCC) 2010   rx included in past prednisone, retuximab, splenectomy  . Irritable bowel syndrome 05/12/2012   Past Surgical History:  Procedure Laterality Date  . APPENDECTOMY  1982  . 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 TONSILECTOMY/ADENOIDECTOMY WITH MYRINGOTOMY     childhood   . total knee Left 2012   Family History  Adopted: Yes   Social History   Socioeconomic History  . Marital status: Married    Spouse name: Not on file  . Number of children: Not on file  . Years of education: Not on file  . Highest education level: Not on file  Occupational History  . Occupation: Retired   Tobacco Use  . Smoking status: Never Smoker  . Smokeless tobacco: Never Used  Substance and Sexual Activity  . Alcohol use: Yes    Comment: wine 2 x a week  . Drug use: Never  . Sexual activity: Not on file  Other Topics Concern  . 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 Difficulty of Paying Living Expenses: Not hard at all  Food Insecurity: No Food Insecurity  . Worried About Programme researcher, broadcasting/film/video in the Last Year: Never true  . Ran Out of Food in the Last Year: Never true  Transportation Needs: No Transportation Needs  . Lack of Transportation (Medical): No  . Lack of Transportation (Non-Medical): No  Physical Activity: Sufficiently Active  .  Days of Exercise per Week: 3 days  . Minutes of Exercise per Session: 60 min  Stress: No Stress Concern Present  . Feeling of Stress : Only a little  Social Connections: Moderately Integrated  . Frequency of Communication with Friends and Family: More than three times a week  . Frequency of Social Gatherings with Friends and Family: More than three times a week  . Attends Religious Services: More than 4 times per year  . Active Member of Clubs or Organizations: No  . Attends Banker Meetings: Never  . Marital Status: Married    Tobacco Counseling Counseling given: Not Answered   Clinical Intake:  Pre-visit preparation completed: Yes  Pain : No/denies pain     BMI - recorded: 45.57 Nutritional Status: BMI > 30  Obese Nutritional Risks: None Diabetes: Yes CBG done?: No Did pt. bring in CBG monitor from home?: No  How often do you need to have someone help you when you read instructions, pamphlets, or other written materials from your doctor or pharmacy?: 1 - Never  Diabetic?no  Interpreter Needed?: No  Information entered by :: Lanier Ensign, LPN   Activities of Daily Living In your present state of health, do you have any difficulty performing the following activities: 02/03/2021  Hearing? Y  Comment wears hearing aid  Vision? N  Difficulty concentrating or making decisions? N  Walking or climbing stairs? N  Dressing or bathing? N  Doing errands, shopping? N  Preparing Food and eating ? N  Using the Toilet? N  In the past six months, have you accidently leaked urine? N  Do you have problems with loss of bowel control? N  Managing your Medications? N  Managing your Finances? N  Housekeeping or managing your Housekeeping? N  Some recent data might be hidden    Patient Care Team: Jarold Motto, [place] as PCP - General (Physician Assistant) Parke Poisson, MD as PCP - Cardiology (Cardiology) Donia Guiles, MD as Referring Physician (Hematology  and Oncology)  Indicate any recent Medical Services you may have received from other than [facility] providers in the past year (date may be approximate).     Assessment:   This is a routine wellness examination for Rhylie.  Hearing/Vision screen  Hearing Screening   125Hz  250Hz  500Hz  1000Hz  2000Hz  3000Hz  4000Hz  6000Hz  8000Hz   Right ear:           Left ear:           Comments: Pt wears hearing aid   Vision Screening Comments: Pt follows up with walk in eye provider on [address]   Dietary issues and exercise activities discussed: Current Exercise Habits: Home exercise routine;Structured exercise class, Type of exercise: Other - see comments (silver sneakers), Time (Minutes): 60, Frequency (Times/Week): 3, Weekly Exercise (Minutes/Week): 180  Goals Addressed            This Visit's Progress   . Patient Stated       Lose weight  Depression Screen PHQ 2/9 Scores 02/03/2021 01/27/2021 01/05/2020 03/25/2019  PHQ - 2 Score 1 0 0 0    Fall Risk Fall Risk  02/03/2021 01/05/2020  Falls in the past year? 0 0  Number falls in past yr: 0 0  Injury with Fall? 0 0  Risk for fall due to : Impaired balance/gait;Impaired vision -  Follow up Falls prevention discussed Falls evaluation completed;Education provided;Falls prevention discussed    FALL RISK PREVENTION PERTAINING TO THE HOME:  Any stairs in or around the home? No  If so, are there any without handrails? No  Home free of loose throw rugs in walkways, pet beds, electrical cords, etc? Yes  Adequate lighting in your home to reduce risk of falls? Yes   ASSISTIVE DEVICES UTILIZED TO PREVENT FALLS:  Life alert? No  Use of a cane, walker or w/c? Yes  Grab bars in the bathroom? No  Shower chair or bench in shower? No  Elevated toilet seat or a handicapped toilet? No   TIMED UP AND GO:  Was the test performed? No     Cognitive Function:     6CIT Screen 02/03/2021 01/05/2020  What Year? 0 points 0 points  What month? 0 points  0 points  What time? 0 points 0 points  Count back from 20 0 points 0 points  Months in reverse 0 points 0 points  Repeat phrase 0 points 0 points  Total Score 0 0    Immunizations Immunization History  Administered Date(s) Administered  . Influenza Split 07/30/2015  . Influenza,inj,Quad PF,6+ Mos 06/12/2016, 06/25/2017, 06/25/2018, 06/17/2019  . Meningococcal B Recombinant 10/17/2016  . Meningococcal B, OMV 10/17/2016, 12/22/2016  . Meningococcal Conjugate 10/17/2016, 12/22/2016  . Moderna Sars-Covid-2 Vaccination 11/28/2019, 12/27/2019, 07/17/2020  . Pneumococcal Conjugate-13 10/17/2016  . Pneumococcal Polysaccharide-23 12/21/2016  . Td 08/27/2006  . Tdap 02/29/2016  . Zoster Recombinat (Shingrix) 01/28/2021    TDAP status: Up to date  Flu Vaccine status: Due, Education has been provided regarding the importance of this vaccine. Advised may receive this vaccine at local pharmacy or Health Dept. Aware to provide a copy of the vaccination record if obtained from local pharmacy or Health Dept. Verbalized acceptance and understanding.  Pneumococcal vaccine status: Up to date  Covid-19 vaccine status: Completed vaccines  Qualifies for Shingles Vaccine? Yes   Zostavax completed Yes   Shingrix Completed?: Yes  Screening Tests Health Maintenance  Topic Date Due  . HIV Screening  Never done  . Hepatitis C Screening  Never done  . MAMMOGRAM  Never done  . Zoster Vaccines- Shingrix (1 of 2) 10/11/2008  . INFLUENZA VACCINE  04/11/2021  . PAP SMEAR-Modifier  01/28/2024  . TETANUS/TDAP  02/28/2026  . COLONOSCOPY (Pts 45-73yrs Insurance coverage will need to be confirmed)  08/11/2028  . COVID-19 Vaccine  Completed  . HPV VACCINES  Aged Out    Health Maintenance  Health Maintenance Due  Topic Date Due  . HIV Screening  Never done  . Hepatitis C Screening  Never done  . MAMMOGRAM  Never done  . Zoster Vaccines- Shingrix (1 of 2) 10/11/2008    Colorectal cancer screening:  Type of screening: Colonoscopy. Completed 08/11/18. Repeat every 10 years  Mammogram status: Ordered 01/27/21. Pt provided with contact info and advised to call to schedule appt.     Additional Screening:  Hepatitis C Screening: does qualify  Vision Screening: Recommended annual ophthalmology exams for early detection of glaucoma and other disorders of the eye. Is  the patient up to date with their annual eye exam?  Yes  Who is the provider or what is the name of the office in which the patient attends annual eye exams? Eye provider on [address]  If pt is not established with a provider, would they like to be referred to a provider to establish care? No .   Dental Screening: Recommended annual dental exams for proper oral hygiene  Community Resource Referral / Chronic Care Management: CRR required this visit?  No   CCM required this visit?  No      Plan:     I have personally reviewed and noted the following in the patient's chart:   . Medical and social history . Use of alcohol, tobacco or illicit drugs  . Current medications and supplements including opioid prescriptions.  . Functional ability and status . Nutritional status . Physical activity . Advanced directives . List of other physicians . Hospitalizations, surgeries, and ER visits in previous 12 months . Vitals . Screenings to include cognitive, depression, and falls . Referrals and appointments  In addition, I have reviewed and discussed with patient certain preventive protocols, quality metrics, and best practice recommendations. A written personalized care plan for preventive services as well as general preventive health recommendations were provided to patient.     Marzella Schleinina H Dyshon Philbin, LPN   1/61/09605/26/2022   Nurse Notes: none

## 2021-02-03 NOTE — Patient Instructions (Addendum)
Connie Mosley , Thank you for taking time to come for your Medicare Wellness Visit. I appreciate your ongoing commitment to your health goals. Please review the following plan we discussed and let me know if I can assist you in the future.   Screening recommendations/referrals: Colonoscopy: Done 08/11/18 Mammogram: Ordered 01/27/21 Recommended yearly ophthalmology/optometry visit for glaucoma screening and checkup Recommended yearly dental visit for hygiene and checkup  Vaccinations: Influenza vaccine: Due in season Pneumococcal vaccine: Up to date Tdap vaccine: Up to date Shingles vaccine: 1st dose 01/28/21  Covid-19: Completed 3/19 & 12/27/19  Advanced directives: Please bring a copy of your health care power of attorney and living will to the office at your convenience.   Conditions/risks identified: Lose weight   Next appointment: Follow up in one year for your annual wellness visit.   Preventive Care 40-64 Years, Female Preventive care refers to lifestyle choices and visits with your health care provider that can promote health and wellness. What does preventive care include?  A yearly physical exam. This is also called an annual well check.  Dental exams once or twice a year.  Routine eye exams. Ask your health care provider how often you should have your eyes checked.  Personal lifestyle choices, including:  Daily care of your teeth and gums.  Regular physical activity.  Eating a healthy diet.  Avoiding tobacco and drug use.  Limiting alcohol use.  Practicing safe sex.  Taking low-dose aspirin daily starting at age 72.  Taking vitamin and mineral supplements as recommended by your health care provider. What happens during an annual well check? The services and screenings done by your health care provider during your annual well check will depend on your age, overall health, lifestyle risk factors, and family history of disease. Counseling  Your health care  provider may ask you questions about your:  Alcohol use.  Tobacco use.  Drug use.  Emotional well-being.  Home and relationship well-being.  Sexual activity.  Eating habits.  Work and work Statistician.  Method of birth control.  Menstrual cycle.  Pregnancy history. Screening  You may have the following tests or measurements:  Height, weight, and BMI.  Blood pressure.  Lipid and cholesterol levels. These may be checked every 5 years, or more frequently if you are over 29 years old.  Skin check.  Lung cancer screening. You may have this screening every year starting at age 10 if you have a 30-pack-year history of smoking and currently smoke or have quit within the past 15 years.  Fecal occult blood test (FOBT) of the stool. You may have this test every year starting at age 54.  Flexible sigmoidoscopy or colonoscopy. You may have a sigmoidoscopy every 5 years or a colonoscopy every 10 years starting at age 76.  Hepatitis C blood test.  Hepatitis B blood test.  Sexually transmitted disease (STD) testing.  Diabetes screening. This is done by checking your blood sugar (glucose) after you have not eaten for a while (fasting). You may have this done every 1-3 years.  Mammogram. This may be done every 1-2 years. Talk to your health care provider about when you should start having regular mammograms. This may depend on whether you have a family history of breast cancer.  BRCA-related cancer screening. This may be done if you have a family history of breast, ovarian, tubal, or peritoneal cancers.  Pelvic exam and Pap test. This may be done every 3 years starting at age 57. Starting at age 69,  this may be done every 5 years if you have a Pap test in combination with an HPV test.  Bone density scan. This is done to screen for osteoporosis. You may have this scan if you are at high risk for osteoporosis. Discuss your test results, treatment options, and if necessary, the need  for more tests with your health care provider. Vaccines  Your health care provider may recommend certain vaccines, such as:  Influenza vaccine. This is recommended every year.  Tetanus, diphtheria, and acellular pertussis (Tdap, Td) vaccine. You may need a Td booster every 10 years.  Zoster vaccine. You may need this after age 90.  Pneumococcal 13-valent conjugate (PCV13) vaccine. You may need this if you have certain conditions and were not previously vaccinated.  Pneumococcal polysaccharide (PPSV23) vaccine. You may need one or two doses if you smoke cigarettes or if you have certain conditions. Talk to your health care provider about which screenings and vaccines you need and how often you need them. This information is not intended to replace advice given to you by your health care provider. Make sure you discuss any questions you have with your health care provider. Document Released: 09/24/2015 Document Revised: 05/17/2016 Document Reviewed: 06/29/2015 Elsevier Interactive Patient Education  2017 [place] Prevention in the Home Falls can cause injuries. They can happen to people of all ages. There are many things you can do to make your home safe and to help prevent falls. What can I do on the outside of my home?  Regularly fix the edges of walkways and driveways and fix any cracks.  Remove anything that might make you trip as you walk through a door, such as a raised step or threshold.  Trim any bushes or trees on the path to your home.  Use bright outdoor lighting.  Clear any walking paths of anything that might make someone trip, such as rocks or tools.  Regularly check to see if handrails are loose or broken. Make sure that both sides of any steps have handrails.  Any raised decks and porches should have guardrails on the edges.  Have any leaves, snow, or ice cleared regularly.  Use sand or salt on walking paths during winter.  Clean up any spills in  your garage right away. This includes oil or grease spills. What can I do in the bathroom?  Use night lights.  Install grab bars by the toilet and in the tub and shower. Do not use towel bars as grab bars.  Use non-skid mats or decals in the tub or shower.  If you need to sit down in the shower, use a plastic, non-slip stool.  Keep the floor dry. Clean up any water that spills on the floor as soon as it happens.  Remove soap buildup in the tub or shower regularly.  Attach bath mats securely with double-sided non-slip rug tape.  Do not have throw rugs and other things on the floor that can make you trip. What can I do in the bedroom?  Use night lights.  Make sure that you have a light by your bed that is easy to reach.  Do not use any sheets or blankets that are too big for your bed. They should not hang down onto the floor.  Have a firm chair that has side arms. You can use this for support while you get dressed.  Do not have throw rugs and other things on the floor that can make  you trip. What can I do in the kitchen?  Clean up any spills right away.  Avoid walking on wet floors.  Keep items that you use a lot in easy-to-reach places.  If you need to reach something above you, use a strong step stool that has a grab bar.  Keep electrical cords out of the way.  Do not use floor polish or wax that makes floors slippery. If you must use wax, use non-skid floor wax.  Do not have throw rugs and other things on the floor that can make you trip. What can I do with my stairs?  Do not leave any items on the stairs.  Make sure that there are handrails on both sides of the stairs and use them. Fix handrails that are broken or loose. Make sure that handrails are as long as the stairways.  Check any carpeting to make sure that it is firmly attached to the stairs. Fix any carpet that is loose or worn.  Avoid having throw rugs at the top or bottom of the stairs. If you do have  throw rugs, attach them to the floor with carpet tape.  Make sure that you have a light switch at the top of the stairs and the bottom of the stairs. If you do not have them, ask someone to add them for you. What else can I do to help prevent falls?  Wear shoes that:  Do not have high heels.  Have rubber bottoms.  Are comfortable and fit you well.  Are closed at the toe. Do not wear sandals.  If you use a stepladder:  Make sure that it is fully opened. Do not climb a closed stepladder.  Make sure that both sides of the stepladder are locked into place.  Ask someone to hold it for you, if possible.  Clearly mark and make sure that you can see:  Any grab bars or handrails.  First and last steps.  Where the edge of each step is.  Use tools that help you move around (mobility aids) if they are needed. These include:  Canes.  Walkers.  Scooters.  Crutches.  Turn on the lights when you go into a dark area. Replace any light bulbs as soon as they burn out.  Set up your furniture so you have a clear path. Avoid moving your furniture around.  If any of your floors are uneven, fix them.  If there are any pets around you, be aware of where they are.  Review your medicines with your doctor. Some medicines can make you feel dizzy. This can increase your chance of falling. Ask your doctor what other things that you can do to help prevent falls. This information is not intended to replace advice given to you by your health care provider. Make sure you discuss any questions you have with your health care provider. Document Released: 06/24/2009 Document Revised: 02/03/2016 Document Reviewed: 10/02/2014 Elsevier Interactive Patient Education  2017 [employer].

## 2021-02-14 ENCOUNTER — Ambulatory Visit: Payer: Medicare Other | Admitting: Orthopaedic Surgery

## 2021-02-14 ENCOUNTER — Other Ambulatory Visit: Payer: Self-pay

## 2021-02-14 ENCOUNTER — Ambulatory Visit: Payer: Self-pay

## 2021-02-14 ENCOUNTER — Encounter: Payer: Self-pay | Admitting: Orthopaedic Surgery

## 2021-02-14 VITALS — Ht 68.5 in | Wt 312.6 lb

## 2021-02-14 DIAGNOSIS — M25561 Pain in right knee: Secondary | ICD-10-CM | POA: Diagnosis not present

## 2021-02-14 DIAGNOSIS — G8929 Other chronic pain: Secondary | ICD-10-CM

## 2021-02-14 DIAGNOSIS — M1711 Unilateral primary osteoarthritis, right knee: Secondary | ICD-10-CM | POA: Diagnosis not present

## 2021-02-14 MED ORDER — NABUMETONE 750 MG PO TABS: 750.0000 mg | ORAL_TABLET | Freq: Two times a day (BID) | ORAL | 3 refills | Status: DC | PRN

## 2021-02-14 NOTE — Progress Notes (Signed)
Office Visit Note   Patient: Connie Mosley           Date of Birth: [birth year]           MRN: [id number] Visit Date: 02/14/2021              Requested by: Jarold Motto, [place] [address] [place],  [place] [address] PCP: Jarold Motto, [place]   Assessment & Plan: Visit Diagnoses:  1. Chronic pain of right knee   2. Unilateral primary osteoarthritis, right knee     Plan: I agree with the fact that she does have severe end-stage arthritis of her right knee and is in need of a knee replacement.  I explained to her the limitations given her BMI.  We would need to see her lose more weight to be able to qualify for the surgery.  I explained this to her in detail.  She does not have a very large soft tissue envelope around her right knee that would make surgery significantly difficult but her weight is a factor that plays into a high risk of failure of knee replacement surgery.  I would like to see her back in 3 weeks with a new weight and BMI calculation.  I will send in some Relafen as an anti-inflammatory in the interim.  All questions concerns were answered and addressed.  She certainly understands why we cannot schedule her just yet.  Follow-Up Instructions: Return in about 3 months (around 05/17/2021).   Orders:  Orders Placed This Encounter  Procedures  . XR Knee 1-2 Views Right   Meds ordered this encounter  Medications  . nabumetone (RELAFEN) 750 MG tablet    Sig: Take 1 tablet (750 mg total) by mouth 2 (two) times daily as needed.    Dispense:  60 tablet    Refill:  3      Procedures: No procedures performed   Clinical Data: No additional findings.   Subjective: Chief Complaint  Patient presents with  . Right Knee - Pain  The patient is a very pleasant 60 year old female who comes in for evaluation treatment of known severe arthritis of her right knee.  She originally had her left knee replaced about a decade ago or more in [place].  She recently saw one  of my colleagues in town and 2 months ago he placed a steroid injection in her right knee.  She still has severe pain in that knee whether she is weightbearing or not.  She does take Tylenol to help with the arthritis pain.  She is not a diabetic.  Her pain is definitely worse with weightbearing.  Her BMI is 46.84.  She is hoping to have a knee replacement for her right knee at some point since her left knee replacement is done so well since it was replaced I believe in 2010.  She does offload her right knee using a cane in her left hand.  HPI  Review of Systems She currently denies a headache, chest pain, shortness of breath, fever, chills, nausea, vomiting  Objective: Vital Signs: Ht 5' 8.5" (1.74 m)   Wt (!) 312 lb 9.6 oz (141.8 kg)   LMP 01/04/2010   BMI 46.84 kg/m   Physical Exam She is alert and oriented x3 and in no acute distress Ortho Exam Examination of her left knee shows a well-healed midline surgical incision.  That knee is nice and straight and is stable on exam.  There is no swelling.  Her  right knee does have valgus malalignment.  There is a mild effusion.  There is painful arc of motion of that left knee with significant grinding of the patellofemoral joint. Specialty Comments:  No specialty comments available.  Imaging: XR Knee 1-2 Views Right  Result Date: 02/14/2021 2 views of the right knee show severe end-stage arthritis of the right knee with valgus malalignment, there is significant loss of the joint space of the patellofemoral joint and the lateral compartment the knee as well as osteophytes in all 3 compartments.    PMFS History: Patient Active Problem List   Diagnosis Date Noted  . Unilateral primary osteoarthritis, right knee 02/14/2021  . Insulin resistance 01/05/2020  . History of non anemic vitamin B12 deficiency 04/22/2018  . Morbid obesity due to excess calories (HCC) 06/26/2017  . 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 Irritable bowel syndrome 05/12/2012   . Hypertension 09/11/2008  . Idiopathic autoimmune hemolytic anemia (HCC) 2010   Past Medical History:  Diagnosis Date  . Hypertension 09/11/2008  . Idiopathic autoimmune hemolytic anemia (HCC) 2010   rx included in past prednisone, retuximab, splenectomy  . Irritable bowel syndrome 05/12/2012    Family History  Adopted: Yes    Past Surgical History:  Procedure Laterality Date  . APPENDECTOMY  1982  . 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 TONSILECTOMY/ADENOIDECTOMY WITH MYRINGOTOMY     childhood   . total knee Left 2012   Social History   Occupational History  . Occupation: Retired   Tobacco Use  . Smoking status: Never Smoker  . Smokeless tobacco: Never Used  Substance and Sexual Activity  . Alcohol use: Yes    Comment: wine 2 x a week  . Drug use: Never  . Sexual activity: Not on file

## 2021-02-16 ENCOUNTER — Encounter: Payer: Self-pay | Admitting: Physician Assistant

## 2021-02-22 ENCOUNTER — Other Ambulatory Visit: Payer: Self-pay | Admitting: Family

## 2021-02-24 ENCOUNTER — Other Ambulatory Visit (HOSPITAL_COMMUNITY): Payer: Medicare Other

## 2021-03-22 ENCOUNTER — Other Ambulatory Visit (HOSPITAL_COMMUNITY): Payer: Medicare Other

## 2021-03-23 ENCOUNTER — Other Ambulatory Visit (HOSPITAL_COMMUNITY): Payer: Medicare Other

## 2021-03-28 ENCOUNTER — Other Ambulatory Visit: Payer: Self-pay

## 2021-03-28 ENCOUNTER — Ambulatory Visit (HOSPITAL_COMMUNITY): Payer: Medicare Other | Attending: Internal Medicine

## 2021-03-28 DIAGNOSIS — I493 Ventricular premature depolarization: Secondary | ICD-10-CM | POA: Diagnosis not present

## 2021-03-28 LAB — ECHOCARDIOGRAM COMPLETE
Area-P 1/2: 2.3 cm2
S' Lateral: 4.3 cm

## 2021-03-28 MED ORDER — PERFLUTREN LIPID MICROSPHERE
1.0000 mL | INTRAVENOUS | Status: AC | PRN
  Administered 2021-03-28: 3 mL via INTRAVENOUS
  Administered 2021-03-28: 2 mL via INTRAVENOUS

## 2021-05-09 ENCOUNTER — Other Ambulatory Visit: Payer: Self-pay

## 2021-05-09 ENCOUNTER — Ambulatory Visit
Admission: RE | Admit: 2021-05-09 | Discharge: 2021-05-09 | Disposition: A | Discharge status: Home / Self Care | Payer: Medicare Other | Source: Ambulatory Visit | Attending: Family | Admitting: Family

## 2021-05-09 DIAGNOSIS — Z1231 Encounter for screening mammogram for malignant neoplasm of breast: Secondary | ICD-10-CM

## 2021-05-17 ENCOUNTER — Ambulatory Visit: Payer: Medicare Other | Admitting: Orthopaedic Surgery

## 2021-06-14 ENCOUNTER — Other Ambulatory Visit: Payer: Self-pay | Admitting: Orthopaedic Surgery

## 2021-06-22 ENCOUNTER — Other Ambulatory Visit: Payer: Self-pay | Admitting: *Deleted

## 2021-06-22 MED ORDER — METOPROLOL TARTRATE 25 MG PO TABS: 25.0000 mg | ORAL_TABLET | Freq: Two times a day (BID) | ORAL | 0 refills | Status: DC

## 2021-07-05 ENCOUNTER — Ambulatory Visit: Payer: Medicare Other | Admitting: Orthopaedic Surgery

## 2021-07-05 DIAGNOSIS — Z6841 Body Mass Index (BMI) 40.0 and over, adult: Secondary | ICD-10-CM

## 2021-07-05 DIAGNOSIS — M1711 Unilateral primary osteoarthritis, right knee: Secondary | ICD-10-CM | POA: Diagnosis not present

## 2021-07-05 MED ORDER — LIDOCAINE HCL 1 % IJ SOLN
3.0000 mL | INTRAMUSCULAR | Status: AC | PRN
  Administered 2021-07-05: 3 mL

## 2021-07-05 MED ORDER — METHYLPREDNISOLONE ACETATE 40 MG/ML IJ SUSP
40.0000 mg | INTRAMUSCULAR | Status: AC | PRN
  Administered 2021-07-05: 40 mg via INTRA_ARTICULAR

## 2021-07-05 NOTE — Progress Notes (Signed)
   Procedure Note  Patient: Connie Mosley             Date of Birth: [birth year]           MRN: [id number]             Visit Date: 07/05/2021 HPI: Connie Mosley returns today for 53-month follow-up of her right knee.  She has known severe end-stage arthritis right knee.  She has been working on weight loss.  She comes in today for height and weight check.  She states she has been walking and trying to watch her diet.  She is now having increased pain from the right knee up into her right hip area.  No new injury.  She is nondiabetic.  Review of systems see HPI otherwise negative  Physical exam: Height 5 foot 8.5 inches weight 309 pounds BMI 46.3 kg/m  Right knee good range of motion no abnormal warmth erythema.  Slight valgus malalignment.  Patellofemoral crepitus with passive range of motion.  Right hip good range of motion without pain.    Procedures: Visit Diagnoses:  1. Class 3 severe obesity with body mass index (BMI) of 40.0 to 44.9 in adult, unspecified obesity type, unspecified whether serious comorbidity present (HCC)   2. Unilateral primary osteoarthritis, right knee     Large Joint Inj: R knee on 07/05/2021 8:41 AM Indications: pain Details: 22 G 1.5 in needle, anterolateral approach  Arthrogram: No  Medications: 3 mL lidocaine 1 %; 40 mg methylPREDNISolone acetate 40 MG/ML Outcome: tolerated well, no immediate complications Procedure, treatment alternatives, risks and benefits explained, specific risks discussed. Consent was given by the patient. Immediately prior to procedure a time out was called to verify the correct patient, procedure, equipment, support staff and site/side marked as required. Patient was prepped and draped in the usual sterile fashion.     Plan: At this point time we will refer her to weight loss clinic.  We will see her back in 3 months for height and weight check.  She tolerated the cortisone injection knee well today.  Questions were encouraged and  answered at length.

## 2021-07-20 ENCOUNTER — Other Ambulatory Visit: Payer: Self-pay | Admitting: Physician Assistant

## 2021-07-24 ENCOUNTER — Other Ambulatory Visit: Payer: Self-pay | Admitting: Family

## 2021-07-28 ENCOUNTER — Other Ambulatory Visit: Payer: Self-pay

## 2021-07-28 ENCOUNTER — Ambulatory Visit (INDEPENDENT_AMBULATORY_CARE_PROVIDER_SITE_OTHER): Payer: Medicare Other

## 2021-07-28 DIAGNOSIS — Z23 Encounter for immunization: Secondary | ICD-10-CM | POA: Diagnosis not present

## 2021-09-11 ENCOUNTER — Other Ambulatory Visit: Payer: Self-pay | Admitting: Family

## 2021-12-09 ENCOUNTER — Other Ambulatory Visit: Payer: Self-pay | Admitting: Orthopaedic Surgery

## 2022-01-17 ENCOUNTER — Ambulatory Visit: Payer: Medicare Other | Admitting: Family Medicine

## 2022-01-17 ENCOUNTER — Encounter: Payer: Self-pay | Admitting: Family Medicine

## 2022-01-17 VITALS — BP 150/86 | HR 85 | Temp 97.0°F | Ht 68.75 in | Wt 317.8 lb

## 2022-01-17 DIAGNOSIS — D5919 Other autoimmune hemolytic anemia: Secondary | ICD-10-CM

## 2022-01-17 DIAGNOSIS — Z6841 Body Mass Index (BMI) 40.0 and over, adult: Secondary | ICD-10-CM

## 2022-01-17 DIAGNOSIS — R7303 Prediabetes: Secondary | ICD-10-CM | POA: Diagnosis not present

## 2022-01-17 DIAGNOSIS — E119 Type 2 diabetes mellitus without complications: Secondary | ICD-10-CM | POA: Insufficient documentation

## 2022-01-17 DIAGNOSIS — Z9081 Acquired absence of spleen: Secondary | ICD-10-CM

## 2022-01-17 DIAGNOSIS — E1149 Type 2 diabetes mellitus with other diabetic neurological complication: Secondary | ICD-10-CM | POA: Insufficient documentation

## 2022-01-17 DIAGNOSIS — Z23 Encounter for immunization: Secondary | ICD-10-CM | POA: Diagnosis not present

## 2022-01-17 DIAGNOSIS — M1711 Unilateral primary osteoarthritis, right knee: Secondary | ICD-10-CM

## 2022-01-17 DIAGNOSIS — I493 Ventricular premature depolarization: Secondary | ICD-10-CM | POA: Insufficient documentation

## 2022-01-17 DIAGNOSIS — K219 Gastro-esophageal reflux disease without esophagitis: Secondary | ICD-10-CM

## 2022-01-17 DIAGNOSIS — I10 Essential (primary) hypertension: Secondary | ICD-10-CM | POA: Diagnosis not present

## 2022-01-17 MED ORDER — FAMOTIDINE 40 MG PO TABS: 40.0000 mg | ORAL_TABLET | Freq: Every day | ORAL | 3 refills | Status: DC

## 2022-01-17 MED ORDER — LISINOPRIL-HYDROCHLOROTHIAZIDE 20-12.5 MG PO TABS: 1.0000 | ORAL_TABLET | Freq: Every day | ORAL | 3 refills | Status: DC

## 2022-01-17 NOTE — Progress Notes (Signed)
?[facility] ?[facility] ?[address] ?[place] [address] ?Dept: [phone] ?Dept Fax: [phone] ? ?Transfer of Care Office Visit ? ?Subjective:  ? ? Patient ID: Connie Mosley, female    DOB: 17-Apr-1961, 61 y.o..   MRN: [id number] ? ?Chief Complaint  ?Patient presents with  ? Establish Care  ?  NP-establish care. No concerns.  Not fasting (coffee w/ creamer)   average BP at home 132/82  ? ? ?History of Present Illness: ? ?Patient is in today to establish care. Connie Mosley was born in [place], [place]. 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 After a significant health issue, she became disabled. She and her husband moved to [place] in 2018. She has been married for 18 years. She has three children (39, 36, 34) and two grandchildren. She denies any tobacco or drug use. She drinks wine about twice a week. ? ?Connie Mosley has a history of essential hypertension. She is managed on lisinopril 20 mg daily.  ? ?Connie Mosley has a history of palpitations, apparently secondary to frequent PVCs. She is currently managed on metoprolol and feels this is well controlled.  ? ?Connie Mosley has a history of idiopathic autoimmune hemolytic anemia. She underwent a splenectomy and has had multiple blood transfusions in the past. She is currently seeing Dr, Leonides Schanz (hematology). ? ?Connie Mosley has a history of bilateral knee osteoarthritis. She underwent a left TKJ replacement in the past. She needs the right knee joint replaced, but needs to lose weight first. She notes she has had insulin resistance issues in the past. ? ?Past Medical History: ?Patient Active Problem List  ? Diagnosis Date Noted  ? Prediabetes 01/17/2022  ? Palpitations 01/17/2022  ? GERD (gastroesophageal reflux disease) 01/17/2022  ? Unilateral primary osteoarthritis, right knee 02/14/2021  ? Insulin resistance 01/05/2020  ?  History of non anemic vitamin B12 deficiency 04/22/2018  ? Morbid obesity with BMI of 45.0-49.9, adult (HCC) 06/26/2017  ? 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  ? Irritable bowel syndrome 05/12/2012  ? Essential hypertension 09/11/2008  ? Idiopathic autoimmune hemolytic anemia (HCC) 2010  ? ?Past Surgical History:  ?Procedure Laterality Date  ? APPENDECTOMY  1982  ? SPLENECTOMY  05/2012  ? TONSILECTOMY/ADENOIDECTOMY WITH MYRINGOTOMY    ? childhood   ? TOTAL KNEE ARTHROPLASTY Left 2012  ? ?Family History  ?Adopted: Yes  ? ?Outpatient Medications Prior to Visit  ?Medication Sig Dispense Refill  ? cyanocobalamin (,VITAMIN B-12,) 1000 MCG/ML injection Inject 1 mL (1,000 mcg total) into the muscle every 14 (fourteen) days. 25 mL 0  ? folic acid (FOLVITE) 1 MG tablet Take 1 tablet (1 mg total) by mouth daily. 90 tablet 5  ? metoprolol tartrate (LOPRESSOR) 25 MG tablet TAKE 1 TABLET (25 MG TOTAL) BY MOUTH 2 (TWO) TIMES DAILY. MAY TAKE ADDITIONAL 25 MG(1TABLET) AS NEEDED PRIOR TO EXERCISE (WITHIN 1 HOUR) 60 tablet 1  ? nabumetone (RELAFEN) 750 MG tablet TAKE 1 TABLET BY MOUTH TWICE A DAY AS NEEDED 60 tablet 3  ? famotidine (PEPCID) 40 MG tablet TAKE 1 TABLET BY MOUTH EVERY DAY 90 tablet 1  ? lisinopril (ZESTRIL) 20 MG tablet TAKE 1 TABLET BY MOUTH EVERY DAY 90 tablet 1  ? ?No facility-administered medications prior to visit.  ? ?Allergies  ?Allergen Reactions  ? Cefuroxime Axetil Itching  ? Sulfamethoxazole-Trimethoprim Other (See Comments)  ?  Reacts with Bone Marrow per Hemotologist ?  ?  Cefuroxime Hives and Rash  ?   ?Objective:  ? ?Today's Vitals  ? 01/17/22 0815 01/17/22 1224  ?BP: (!) 156/90 (!) 150/86  ?Pulse: 85   ?Temp: (!) 97 ?F (36.1 ?C)   ?TempSrc: Temporal   ?SpO2: 96%   ?Weight: (!) 317 lb 12.8 oz (144.2 kg)   ?Height: 5' 8.75" (1.746 m)   ? ?Body mass index is 47.27 kg/m?.  ? ?General: Well developed, well nourished. No acute distress. ?Psych: Alert and oriented. Normal mood and affect. ? ?Health Maintenance Due   ?Topic Date Due  ? HIV Screening  Never done  ? Hepatitis C Screening  Never done  ? Zoster Vaccines- Shingrix (2 of 2) 03/25/2021  ?   ?Assessment & Plan:  ? ?1. Essential hypertension ?Connie Mosley' blood pressure is elevated today. She notes he home Bps have been around 132/82. I will plan to switch her to Zestoretic to see if this will get her BP back under control. She should continue to monitor this at home. ? ?- lisinopril-hydrochlorothiazide (ZESTORETIC) 20-12.5 MG tablet; Take 1 tablet by mouth daily.  Dispense: 90 tablet; Refill: 3 ? ?2. Prediabetes ?Connie Mosley' past A1c has been in the prediabetes range. I will plan to recheck this. ? ?- Hemoglobin A1c; Future ? ?3. Morbid obesity with BMI of 45.0-49.9, adult (HCC) ?Connie Mosley notes she is pending an appointment with a weight management clinic. ? ?4. Idiopathic autoimmune hemolytic anemia (HCC) ?5. S/P splenectomy ?Apparently stable. She has an appointment with Dr. Leonides Schanz later this week. ? ?6. Unilateral primary osteoarthritis, right knee ?Working at weight loss to allow for knee joint replacement. In the meantime, she will continue on nabumetone. ? ?7. Gastroesophageal reflux disease without esophagitis ?Stable on Pepcid. ? ?- famotidine (PEPCID) 40 MG tablet; Take 1 tablet (40 mg total) by mouth daily.  Dispense: 90 tablet; Refill: 3 ? ?Return in about 3 months (around 04/19/2022) for Reassessment.  ? ?Loyola Mast, MD ?

## 2022-01-18 ENCOUNTER — Encounter: Payer: Self-pay | Admitting: Hematology and Oncology

## 2022-01-19 ENCOUNTER — Inpatient Hospital Stay: Payer: Medicare Other | Attending: Hematology and Oncology

## 2022-01-19 ENCOUNTER — Other Ambulatory Visit: Payer: Self-pay | Admitting: Hematology and Oncology

## 2022-01-19 ENCOUNTER — Other Ambulatory Visit: Payer: Self-pay | Admitting: Family

## 2022-01-19 ENCOUNTER — Other Ambulatory Visit: Payer: Self-pay | Admitting: Family Medicine

## 2022-01-19 ENCOUNTER — Inpatient Hospital Stay: Payer: Medicare Other | Admitting: Hematology and Oncology

## 2022-01-19 ENCOUNTER — Other Ambulatory Visit: Payer: Self-pay

## 2022-01-19 ENCOUNTER — Inpatient Hospital Stay: Payer: Medicare Other

## 2022-01-19 VITALS — BP 142/89 | HR 79 | Temp 97.5°F | Resp 18 | Ht 68.75 in | Wt 310.8 lb

## 2022-01-19 DIAGNOSIS — E538 Deficiency of other specified B group vitamins: Secondary | ICD-10-CM | POA: Insufficient documentation

## 2022-01-19 DIAGNOSIS — D5919 Other autoimmune hemolytic anemia: Secondary | ICD-10-CM

## 2022-01-19 DIAGNOSIS — D591 Autoimmune hemolytic anemia, unspecified: Secondary | ICD-10-CM | POA: Insufficient documentation

## 2022-01-19 LAB — CBC WITH DIFFERENTIAL (CANCER CENTER ONLY)
Abs Immature Granulocytes: 0.03 10*3/uL (ref 0.00–0.07)
Basophils Absolute: 0.1 10*3/uL (ref 0.0–0.1)
Basophils Relative: 1 %
Eosinophils Absolute: 0.3 10*3/uL (ref 0.0–0.5)
Eosinophils Relative: 3 %
HCT: 47 % — ABNORMAL HIGH (ref 36.0–46.0)
Hemoglobin: 15.7 g/dL — ABNORMAL HIGH (ref 12.0–15.0)
Immature Granulocytes: 0 %
Lymphocytes Relative: 40 %
Lymphs Abs: 4.1 10*3/uL — ABNORMAL HIGH (ref 0.7–4.0)
MCH: 29.6 pg (ref 26.0–34.0)
MCHC: 33.4 g/dL (ref 30.0–36.0)
MCV: 88.7 fL (ref 80.0–100.0)
Monocytes Absolute: 1.1 10*3/uL — ABNORMAL HIGH (ref 0.1–1.0)
Monocytes Relative: 11 %
Neutro Abs: 4.5 10*3/uL (ref 1.7–7.7)
Neutrophils Relative %: 45 %
Platelet Count: 303 10*3/uL (ref 150–400)
RBC: 5.3 MIL/uL — ABNORMAL HIGH (ref 3.87–5.11)
RDW: 14.6 % (ref 11.5–15.5)
WBC Count: 10.2 10*3/uL (ref 4.0–10.5)
nRBC: 0 % (ref 0.0–0.2)

## 2022-01-19 LAB — CMP (CANCER CENTER ONLY)
ALT: 14 U/L (ref 0–44)
AST: 16 U/L (ref 15–41)
Albumin: 4.2 g/dL (ref 3.5–5.0)
Alkaline Phosphatase: 78 U/L (ref 38–126)
Anion gap: 7 (ref 5–15)
BUN: 9 mg/dL (ref 8–23)
CO2: 29 mmol/L (ref 22–32)
Calcium: 9.6 mg/dL (ref 8.9–10.3)
Chloride: 103 mmol/L (ref 98–111)
Creatinine: 0.75 mg/dL (ref 0.44–1.00)
GFR, Estimated: >60 mL/min (ref >60)
Glucose, Bld: 136 mg/dL — ABNORMAL HIGH (ref 70–99)
Potassium: 4.1 mmol/L (ref 3.5–5.1)
Sodium: 139 mmol/L (ref 135–145)
Total Bilirubin: 0.7 mg/dL (ref 0.3–1.2)
Total Protein: 7.6 g/dL (ref 6.5–8.1)

## 2022-01-19 LAB — RETIC PANEL
Immature Retic Fract: 10.3 % (ref 2.3–15.9)
RBC.: 5.19 MIL/uL — ABNORMAL HIGH (ref 3.87–5.11)
Retic Count, Absolute: 81 10*3/uL (ref 19.0–186.0)
Retic Ct Pct: 1.6 % (ref 0.4–3.1)
Reticulocyte Hemoglobin: 33 pg (ref >27.9)

## 2022-01-19 LAB — LACTATE DEHYDROGENASE: LDH: 182 U/L (ref 98–192)

## 2022-01-19 NOTE — Progress Notes (Signed)
?[facility] ?[phone]   Fax:([phone] ? ?PROGRESS NOTE ? ?Patient Care Team: ?Loyola Mastudd, Stephen M, MD as PCP - General (Family Medicine) ?Parke PoissonAcharya, Gayatri A, MD as Consulting Physician (Cardiology) ?Jaci Standardorsey, Lovey Crupi T IV, MD as Consulting Physician (Hematology and Oncology) ? ?Hematological/Oncological History ?# Autoimmune Hemolytic Anemia ?08/16/2020: [facility] ?01/19/2021: establish care with Dr. Leonides Schanzorsey ? ?Interval History:  ?Connie Mosley 61 y.o. female with medical history significant for autoimmune hemolytic anemia who presents for a follow up visit. The patient's last visit was on 01/19/2021. In the interim since the last visit she has had no new changes in her health.  ? ?On exam today Connie Mosley reports that she likes to "keep it boring and all".  She notes that she has been feeling well overall and has had good levels of energy, reporting her energy is at 8 out of 10.  She is not having any issues with shortness of breath.  Her appetite has been good though she is doing her best to try to lose some weight for upcoming knee replacements.  She has been taking her continued vitamin B12 and folate and is requesting a refill today.  She denies any dark stools or overt signs of bleeding.  She reports no fevers, chills, sweats, nausea, ming or diarrhea.  A full 10 point ROS is listed below. ? ?MEDICAL HISTORY:  ?Past Medical History:  ?Diagnosis Date  ? Hypertension 09/11/2008  ? Idiopathic autoimmune hemolytic anemia (HCC) 2010  ? rx included in past prednisone, retuximab, splenectomy  ? Irritable bowel syndrome 05/12/2012  ? ? ?SURGICAL HISTORY: ?Past Surgical History:  ?Procedure Laterality Date  ? APPENDECTOMY  1982  ? SPLENECTOMY  05/2012  ? TONSILECTOMY/ADENOIDECTOMY WITH MYRINGOTOMY    ? childhood   ? TOTAL KNEE ARTHROPLASTY Left 2012  ? ? ?SOCIAL HISTORY: ?Social History  ? ?Socioeconomic History  ? Marital status: Married  ?  Spouse name: Not on file   ? Number of children: 3  ? Years of education: Not on file  ? Highest education level: Associate degree: occupational, Scientist, product/process developmenttechnical, or vocational program  ?Occupational History  ? Occupation: Retired   ?Tobacco Use  ? Smoking status: Never  ? Smokeless tobacco: Never  ?Vaping Use  ? Vaping Use: Never used  ?Substance and Sexual Activity  ? Alcohol use: Yes  ?  Comment: wine 2 x a week  ? Drug use: Never  ? Sexual activity: Yes  ?Other Topics Concern  ? Not on file  ?Social History Narrative  ? Married  ? From [place]  ? Former LPN  ? Went on disability in 2013 for her autoimmune disorder  ? Moved to [place] in 2018  ? ?Social Determinants of Health  ? ?Financial Resource Strain: Low Risk   ? Difficulty of Paying Living Expenses: Not hard at all  ?Food Insecurity: No Food Insecurity  ? Worried About Programme researcher, broadcasting/film/videounning Out of Food in the Last Year: Never true  ? Ran Out of Food in the Last Year: Never true  ?Transportation Needs: No Transportation Needs  ? Lack of Transportation (Medical): No  ? Lack of Transportation (Non-Medical): No  ?Physical Activity: Sufficiently Active  ? Days of Exercise per Week: 3 days  ? Minutes of Exercise per Session: 60 min  ?Stress: No Stress Concern Present  ? Feeling of Stress : Only a little  ?Social Connections: Moderately Integrated  ? Frequency of Communication with Friends and Family: More than  three times a week  ? Frequency of Social Gatherings with Friends and Family: More than three times a week  ? Attends Religious Services: More than 4 times per year  ? Active Member of Clubs or Organizations: No  ? Attends Banker Meetings: Never  ? Marital Status: Married  ?Intimate Partner Violence: Not At Risk  ? Fear of Current or Ex-Partner: No  ? Emotionally Abused: No  ? Physically Abused: No  ? Sexually Abused: No  ? ? ?FAMILY HISTORY: ?Family History  ?Adopted: Yes  ? ? ?ALLERGIES:  is allergic to cefuroxime axetil, sulfamethoxazole-trimethoprim, and cefuroxime. ? ?MEDICATIONS:   ?Current Outpatient Medications  ?Medication Sig Dispense Refill  ? famotidine (PEPCID) 40 MG tablet Take 1 tablet (40 mg total) by mouth daily. 90 tablet 3  ? lisinopril-hydrochlorothiazide (ZESTORETIC) 20-12.5 MG tablet Take 1 tablet by mouth daily. 90 tablet 3  ? nabumetone (RELAFEN) 750 MG tablet TAKE 1 TABLET BY MOUTH TWICE A DAY AS NEEDED 60 tablet 3  ? cyanocobalamin (,VITAMIN B-12,) 1000 MCG/ML injection Inject 1 mL (1,000 mcg total) into the muscle every 14 (fourteen) days. 25 mL 0  ? folic acid (FOLVITE) 1 MG tablet Take 1 tablet (1 mg total) by mouth daily. 90 tablet 5  ? metoprolol tartrate (LOPRESSOR) 25 MG tablet TAKE 1 TABLET (25 MG TOTAL) BY MOUTH 2 (TWO) TIMES DAILY. MAY TAKE ADDITIONAL 25 MG(1TABLET) AS NEEDED PRIOR TO EXERCISE (WITHIN 1 HOUR) 60 tablet 1  ? ?No current facility-administered medications for this visit.  ? ? ?REVIEW OF SYSTEMS:   ?Constitutional: ( - ) fevers, ( - )  chills , ( - ) night sweats ?Eyes: ( - ) blurriness of vision, ( - ) double vision, ( - ) watery eyes ?Ears, nose, mouth, throat, and face: ( - ) mucositis, ( - ) sore throat ?Respiratory: ( - ) cough, ( - ) dyspnea, ( - ) wheezes ?Cardiovascular: ( - ) palpitation, ( - ) chest discomfort, ( - ) lower extremity swelling ?Gastrointestinal:  ( - ) nausea, ( - ) heartburn, ( - ) change in bowel habits ?Skin: ( - ) abnormal skin rashes ?Lymphatics: ( - ) new lymphadenopathy, ( - ) easy bruising ?Neurological: ( - ) numbness, ( - ) tingling, ( - ) new weaknesses ?Behavioral/Psych: ( - ) mood change, ( - ) new changes  ?All other systems were reviewed with the patient and are negative. ? ?PHYSICAL EXAMINATION: ? ?Vitals:  ? 01/19/22 1534  ?BP: (!) 142/89  ?Pulse: 79  ?Resp: 18  ?Temp: (!) 97.5 ?F (36.4 ?C)  ?SpO2: 97%  ? ?Filed Weights  ? 01/19/22 1534  ?Weight: (!) 310 lb 12.8 oz (141 kg)  ? ? ?GENERAL: Well-appearing middle-age Caucasian female, alert, no distress and comfortable ?SKIN: skin color, texture, turgor are  normal, no rashes or significant lesions ?EYES: conjunctiva are pink and non-injected, sclera clear ?LUNGS: clear to auscultation and percussion with normal breathing effort ?HEART: regular rate & rhythm and no murmurs and no lower extremity edema ?Musculoskeletal: no cyanosis of digits and no clubbing  ?PSYCH: alert & oriented x 3, fluent speech ?NEURO: no focal motor/sensory deficits ? ?LABORATORY DATA:  ?I have reviewed the data as listed ? ?  Latest Ref Rng & Units 01/19/2022  ?  2:39 PM 01/19/2021  ?  3:33 PM 01/05/2020  ? 10:17 AM  ?CBC  ?WBC 4.0 - 10.5 K/uL 10.2   11.2   9.6    ?Hemoglobin 12.0 -  15.0 g/dL 40.9   81.1   91.4    ?Hematocrit 36.0 - 46.0 % 47.0   47.2   45.6    ?Platelets 150 - 400 K/uL 303   255   258.0    ? ? ? ?  Latest Ref Rng & Units 01/19/2022  ?  2:39 PM 01/19/2021  ?  3:33 PM 01/05/2020  ? 10:17 AM  ?CMP  ?Glucose 70 - 99 mg/dL 782   956   213    ?BUN 8 - 23 mg/dL 9   15   9     ?Creatinine 0.44 - 1.00 mg/dL   0.86   5.78    ?Sodium 135 - 145 mmol/L 139   139   140    ?Potassium 3.5 - 5.1 mmol/L 4.1   4.5   5.0    ?Chloride 98 - 111 mmol/L 103   105   104    ?CO2 22 - 32 mmol/L 29   26   28     ?Calcium 8.9 - 10.3 mg/dL 9.6   9.3   9.6    ?Total Protein 6.5 - 8.1 g/dL 7.6   6.8   6.9    ?Total Bilirubin 0.3 - 1.2 mg/dL 0.7   0.6   1.1    ?Alkaline Phos 38 - 126 U/L 78   72   67    ?AST 15 - 41 U/L 16   17   27     ?ALT 0 - 44 U/L 14   17   26     ? ? ?No results found for: MPROTEIN ?No results found for: KPAFRELGTCHN, LAMBDASER, KAPLAMBRATIO ? ? ?RADIOGRAPHIC STUDIES: ?No results found. ? ?ASSESSMENT & PLAN ?Connie Mosley 61 y.o. female with medical history significant for autoimmune hemolytic anemia who presents for a follow up visit.  ? ?After review of the labs, review of the records, and discussion with the patient, the findings are most consistent with a well-controlled autoimmune hemolytic anemia.  She has been stable since 2017.  She was last seen at [facility] in December 2021 at which time her history was summarized as having undergone rituximab, cyclophosphamide, splenectomy, and finally a long steroid taper which put her into a durable remission in 2017.  S

## 2022-01-20 LAB — HAPTOGLOBIN: Haptoglobin: 93 mg/dL (ref 37–355)

## 2022-01-20 LAB — HGB A1C W/O EAG: Hgb A1c MFr Bld: 6.3 % — ABNORMAL HIGH (ref 4.8–5.6)

## 2022-01-24 MED ORDER — CYANOCOBALAMIN 1000 MCG/ML IJ SOLN: 1000.0000 ug | INTRAMUSCULAR | 0 refills | Status: DC

## 2022-01-24 MED ORDER — FOLIC ACID 1 MG PO TABS: 1.0000 mg | ORAL_TABLET | Freq: Every day | ORAL | 5 refills | Status: DC

## 2022-02-09 ENCOUNTER — Ambulatory Visit: Payer: Medicare Other

## 2022-03-01 ENCOUNTER — Ambulatory Visit (INDEPENDENT_AMBULATORY_CARE_PROVIDER_SITE_OTHER): Payer: Medicare Other

## 2022-03-01 DIAGNOSIS — Z1231 Encounter for screening mammogram for malignant neoplasm of breast: Secondary | ICD-10-CM

## 2022-03-01 DIAGNOSIS — Z0000 Encounter for general adult medical examination without abnormal findings: Secondary | ICD-10-CM

## 2022-03-01 NOTE — Patient Instructions (Signed)
Ms. Connie Mosley , Thank you for taking time to come for your Medicare Wellness Visit. I appreciate your ongoing commitment to your health goals. Please review the following plan we discussed and let me know if I can assist you in the future.   Screening recommendations/referrals: Colonoscopy: 08/11/2018 Mammogram: 05/09/2021 Bone Density: not of age  Recommended yearly ophthalmology/optometry visit for glaucoma screening and checkup Recommended yearly dental visit for hygiene and checkup  Vaccinations: Influenza vaccine: completed  Pneumococcal vaccine: completed  Tdap vaccine: 02/29/2016 Shingles vaccine: completed     Advanced directives: none   Conditions/risks identified: none   Next appointment: none    Preventive Care 65 Years and Older, Female Preventive care refers to lifestyle choices and visits with your health care provider that can promote health and wellness. What does preventive care include? A yearly physical exam. This is also called an annual well check. Dental exams once or twice a year. Routine eye exams. Ask your health care provider how often you should have your eyes checked. Personal lifestyle choices, including: Daily care of your teeth and gums. Regular physical activity. Eating a healthy diet. Avoiding tobacco and drug use. Limiting alcohol use. Practicing safe sex. Taking low-dose aspirin every day. Taking vitamin and mineral supplements as recommended by your health care provider. What happens during an annual well check? The services and screenings done by your health care provider during your annual well check will depend on your age, overall health, lifestyle risk factors, and family history of disease. Counseling  Your health care provider may ask you questions about your: Alcohol use. Tobacco use. Drug use. Emotional well-being. Home and relationship well-being. Sexual activity. Eating habits. History of falls. Memory and ability to  understand (cognition). Work and work Astronomer. Reproductive health. Screening  You may have the following tests or measurements: Height, weight, and BMI. Blood pressure. Lipid and cholesterol levels. These may be checked every 5 years, or more frequently if you are over 79 years old. Skin check. Lung cancer screening. You may have this screening every year starting at age 50 if you have a 30-pack-year history of smoking and currently smoke or have quit within the past 15 years. Fecal occult blood test (FOBT) of the stool. You may have this test every year starting at age 80. Flexible sigmoidoscopy or colonoscopy. You may have a sigmoidoscopy every 5 years or a colonoscopy every 10 years starting at age 30. Hepatitis C blood test. Hepatitis B blood test. Sexually transmitted disease (STD) testing. Diabetes screening. This is done by checking your blood sugar (glucose) after you have not eaten for a while (fasting). You may have this done every 1-3 years. Bone density scan. This is done to screen for osteoporosis. You may have this done starting at age 21. Mammogram. This may be done every 1-2 years. Talk to your health care provider about how often you should have regular mammograms. Talk with your health care provider about your test results, treatment options, and if necessary, the need for more tests. Vaccines  Your health care provider may recommend certain vaccines, such as: Influenza vaccine. This is recommended every year. Tetanus, diphtheria, and acellular pertussis (Tdap, Td) vaccine. You may need a Td booster every 10 years. Zoster vaccine. You may need this after age 16. Pneumococcal 13-valent conjugate (PCV13) vaccine. One dose is recommended after age 35. Pneumococcal polysaccharide (PPSV23) vaccine. One dose is recommended after age 49. Talk to your health care provider about which screenings and vaccines you need and  how often you need them. This information is not  intended to replace advice given to you by your health care provider. Make sure you discuss any questions you have with your health care provider. Document Released: 09/24/2015 Document Revised: 05/17/2016 Document Reviewed: 06/29/2015 Elsevier Interactive Patient Education  2017 [place] Prevention in the Home Falls can cause injuries. They can happen to people of all ages. There are many things you can do to make your home safe and to help prevent falls. What can I do on the outside of my home? Regularly fix the edges of walkways and driveways and fix any cracks. Remove anything that might make you trip as you walk through a door, such as a raised step or threshold. Trim any bushes or trees on the path to your home. Use bright outdoor lighting. Clear any walking paths of anything that might make someone trip, such as rocks or tools. Regularly check to see if handrails are loose or broken. Make sure that both sides of any steps have handrails. Any raised decks and porches should have guardrails on the edges. Have any leaves, snow, or ice cleared regularly. Use sand or salt on walking paths during winter. Clean up any spills in your garage right away. This includes oil or grease spills. What can I do in the bathroom? Use night lights. Install grab bars by the toilet and in the tub and shower. Do not use towel bars as grab bars. Use non-skid mats or decals in the tub or shower. If you need to sit down in the shower, use a plastic, non-slip stool. Keep the floor dry. Clean up any water that spills on the floor as soon as it happens. Remove soap buildup in the tub or shower regularly. Attach bath mats securely with double-sided non-slip rug tape. Do not have throw rugs and other things on the floor that can make you trip. What can I do in the bedroom? Use night lights. Make sure that you have a light by your bed that is easy to reach. Do not use any sheets or blankets that are  too big for your bed. They should not hang down onto the floor. Have a firm chair that has side arms. You can use this for support while you get dressed. Do not have throw rugs and other things on the floor that can make you trip. What can I do in the kitchen? Clean up any spills right away. Avoid walking on wet floors. Keep items that you use a lot in easy-to-reach places. If you need to reach something above you, use a strong step stool that has a grab bar. Keep electrical cords out of the way. Do not use floor polish or wax that makes floors slippery. If you must use wax, use non-skid floor wax. Do not have throw rugs and other things on the floor that can make you trip. What can I do with my stairs? Do not leave any items on the stairs. Make sure that there are handrails on both sides of the stairs and use them. Fix handrails that are broken or loose. Make sure that handrails are as long as the stairways. Check any carpeting to make sure that it is firmly attached to the stairs. Fix any carpet that is loose or worn. Avoid having throw rugs at the top or bottom of the stairs. If you do have throw rugs, attach them to the floor with carpet tape. Make sure that you have  a light switch at the top of the stairs and the bottom of the stairs. If you do not have them, ask someone to add them for you. What else can I do to help prevent falls? Wear shoes that: Do not have high heels. Have rubber bottoms. Are comfortable and fit you well. Are closed at the toe. Do not wear sandals. If you use a stepladder: Make sure that it is fully opened. Do not climb a closed stepladder. Make sure that both sides of the stepladder are locked into place. Ask someone to hold it for you, if possible. Clearly mark and make sure that you can see: Any grab bars or handrails. First and last steps. Where the edge of each step is. Use tools that help you move around (mobility aids) if they are needed. These  include: Canes. Walkers. Scooters. Crutches. Turn on the lights when you go into a dark area. Replace any light bulbs as soon as they burn out. Set up your furniture so you have a clear path. Avoid moving your furniture around. If any of your floors are uneven, fix them. If there are any pets around you, be aware of where they are. Review your medicines with your doctor. Some medicines can make you feel dizzy. This can increase your chance of falling. Ask your doctor what other things that you can do to help prevent falls. This information is not intended to replace advice given to you by your health care provider. Make sure you discuss any questions you have with your health care provider. Document Released: 06/24/2009 Document Revised: 02/03/2016 Document Reviewed: 10/02/2014 Elsevier Interactive Patient Education  2017 [employer].

## 2022-03-01 NOTE — Progress Notes (Signed)
Subjective:   Connie Mosley is a 61 y.o. female who presents for Medicare Annual (Subsequent) preventive examination.   I connected with Connie Mosley  today by telephone and verified that I am speaking with the correct person using two identifiers. Location patient: home Location provider: work Persons participating in the virtual visit: patient, provider.   I discussed the limitations, risks, security and privacy concerns of performing an evaluation and management service by telephone and the availability of in person appointments. I also discussed with the patient that there may be a patient responsible charge related to this service. The patient expressed understanding and verbally consented to this telephonic visit.    Interactive audio and video telecommunications were attempted between this provider and patient, however failed, due to patient having technical difficulties OR patient did not have access to video capability.  We continued and completed visit with audio only.    Review of Systems     Cardiac Risk Factors include: advanced age (>54men, >78 women)     Objective:    Today's Vitals   There is no height or weight on file to calculate BMI.     03/01/2022    9:22 AM 02/03/2021    1:50 PM 01/05/2020    9:22 AM  Advanced Directives  Does Patient Have a Medical Advance Directive? No Yes No  Type of Advance Directive  Healthcare Power of Attorney   Would patient like information on creating a medical advance directive? No - Patient declined  Yes (MAU/Ambulatory/Procedural Areas - Information given)    Current Medications (verified) Outpatient Encounter Medications as of 03/01/2022  Medication Sig   cyanocobalamin (,VITAMIN B-12,) 1000 MCG/ML injection Inject 1 mL (1,000 mcg total) into the muscle every 14 (fourteen) days.   famotidine (PEPCID) 40 MG tablet Take 1 tablet (40 mg total) by mouth daily.   folic acid (FOLVITE) 1 MG tablet Take 1 tablet (1 mg total) by  mouth daily.   lisinopril-hydrochlorothiazide (ZESTORETIC) 20-12.5 MG tablet Take 1 tablet by mouth daily.   nabumetone (RELAFEN) 750 MG tablet TAKE 1 TABLET BY MOUTH TWICE A DAY AS NEEDED   metoprolol tartrate (LOPRESSOR) 25 MG tablet TAKE 1 TABLET (25 MG TOTAL) BY MOUTH 2 (TWO) TIMES DAILY. MAY TAKE ADDITIONAL 25 MG(1TABLET) AS NEEDED PRIOR TO EXERCISE (WITHIN 1 HOUR)   No facility-administered encounter medications on file as of 03/01/2022.    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   TONSILECTOMY/ADENOIDECTOMY WITH MYRINGOTOMY     childhood    TOTAL KNEE ARTHROPLASTY Left 2012   Family History  Adopted: Yes   Social History   Socioeconomic History   Marital status: Married    Spouse name: Not on file   Number of children: 3   Years of education: Not on file   Highest education level: Associate degree: occupational, Scientist, product/process development, or vocational program  Occupational History   Occupation: Retired   Tobacco Use   Smoking status: Never   Smokeless tobacco: Never  Vaping Use   Vaping Use: Never used  Substance and Sexual Activity   Alcohol use: Yes    Comment: wine 2 x a week   Drug use: Never  Sexual activity: Yes  Other Topics Concern   Not on file  Social History Narrative   Married   From [place], [place]   Former LPN   Went on disability in 2013 for her autoimmune disorder   Moved to [place] in 2018   Social Determinants of Health   Financial Resource Strain: Low Risk  (03/01/2022)   Overall Financial Resource Strain (CARDIA)    Difficulty of Paying Living Expenses: Not hard at all  Food Insecurity: No Food  Insecurity (03/01/2022)   Hunger Vital Sign    Worried About Running Out of Food in the Last Year: Never true    Ran Out of Food in the Last Year: Never true  Transportation Needs: No Transportation Needs (03/01/2022)   PRAPARE - Administrator, Civil Service (Medical): No    Lack of Transportation (Non-Medical): No  Physical Activity: Sufficiently Active (03/01/2022)   Exercise Vital Sign    Days of Exercise per Week: 3 days    Minutes of Exercise per Session: 50 min  Stress: No Stress Concern Present (03/01/2022)   [employer] of Occupational Health - Occupational Stress Questionnaire    Feeling of Stress : Not at all  Social Connections: Socially Integrated (03/01/2022)   Social Connection and Isolation Panel [NHANES]    Frequency of Communication with Friends and Family: Twice a week    Frequency of Social Gatherings with Friends and Family: Twice a week    Attends Religious Services: More than 4 times per year    Active Member of [employer] or Organizations: Yes    Attends Engineer, structural: More than 4 times per year    Marital Status: Married    Tobacco Counseling Counseling given: Not Answered   Clinical Intake:  Pre-visit preparation completed: Yes  Pain : No/denies pain     Nutritional Risks: None Diabetes: No  How often do you need to have someone help you when you read instructions, pamphlets, or other written materials from your doctor or pharmacy?: 1 - Never What is the last grade level you completed in school?: college  Diabetic?no   Interpreter Needed?: No  Information entered by :: L.Destane Speas,LPN   Activities of Daily Living    03/01/2022    9:26 AM  In your present state of health, do you have any difficulty performing the following activities:  Hearing? 0  Vision? 0  Difficulty concentrating or making decisions? 0  Walking or climbing stairs? 0  Dressing or bathing? 0  Doing errands, shopping? 0  Preparing Food and eating  ? N  Using the Toilet? N  In the past six months, have you accidently leaked urine? N  Do you have problems with loss of bowel control? N  Managing your Medications? N  Managing your Finances? N  Housekeeping or managing your Housekeeping? N    Patient Care Team: Loyola Mast, MD as PCP - General (Family Medicine) Parke Poisson, MD as Consulting Physician (Cardiology) Jaci Standard, MD as Consulting Physician (Hematology and Oncology)  Indicate any recent Medical Services you may have received from other than [facility] providers in the past year (date may be approximate).     Assessment:   This is a routine wellness examination for Jaliya.  Hearing/Vision screen Vision Screening - Comments:: Annual eye exams wears glasses   Dietary issues and exercise activities discussed: Current Exercise Habits: Home exercise routine, Type of exercise: walking;strength training/weights, Time (Minutes): 30, Frequency (Times/Week): 3, Weekly Exercise (  Minutes/Week): 90, Intensity: Mild, Exercise limited by: None identified   Goals Addressed             This Visit's Progress    Patient Stated   On track    Lose weight        Depression Screen    03/01/2022    9:22 AM 03/01/2022    9:20 AM 01/17/2022    8:14 AM 02/03/2021    1:47 PM 01/27/2021    1:01 PM 01/05/2020    9:23 AM 03/25/2019    8:20 AM  PHQ 2/9 Scores  PHQ - 2 Score 0 0 0 1 0 0 0    Fall Risk    03/01/2022    9:24 AM 01/17/2022    8:15 AM 02/03/2021    1:51 PM 01/05/2020    9:23 AM  Fall Risk   Falls in the past year? 0 1 0 0  Number falls in past yr: 0 0 0 0  Injury with Fall? 0 0 0 0  Risk for fall due to :  No Fall Risks Impaired balance/gait;Impaired vision   Follow up Falls evaluation completed;Education provided Falls evaluation completed Falls prevention discussed Falls evaluation completed;Education provided;Falls prevention discussed    FALL RISK PREVENTION PERTAINING TO THE HOME:  Any stairs in or  around the home? No  If so, are there any without handrails? No  Home free of loose throw rugs in walkways, pet beds, electrical cords, etc? Yes  Adequate lighting in your home to reduce risk of falls? Yes   ASSISTIVE DEVICES UTILIZED TO PREVENT FALLS:  Life alert? No  Use of a cane, walker or w/c? No  Grab bars in the bathroom? No  Shower chair or bench in shower? Yes  Elevated toilet seat or a handicapped toilet? Yes     Cognitive Function:    Normal cognitive status assessed telephone conversation by this Nurse Health Advisor. No abnormalities found.      02/03/2021    1:55 PM 01/05/2020    9:23 AM  6CIT Screen  What Year? 0 points 0 points  What month? 0 points 0 points  What time? 0 points 0 points  Count back from 20 0 points 0 points  Months in reverse 0 points 0 points  Repeat phrase 0 points 0 points  Total Score 0 points 0 points    Immunizations Immunization History  Administered Date(s) Administered   Influenza Split 07/30/2015   Influenza,inj,Quad PF,6+ Mos 06/12/2016, 06/25/2017, 06/25/2018, 06/17/2019, 07/28/2021   Meningococcal B Recombinant 10/17/2016   Meningococcal B, OMV 10/17/2016, 12/22/2016   Meningococcal Conjugate 10/17/2016, 12/22/2016   Moderna Sars-Covid-2 Vaccination 11/28/2019, 12/27/2019, 07/17/2020   Pneumococcal Conjugate-13 10/17/2016   Pneumococcal Polysaccharide-23 12/21/2016   Td 08/27/2006   Tdap 02/29/2016   Zoster Recombinat (Shingrix) 01/28/2021, 01/17/2022    TDAP status: Up to date  Flu Vaccine status: Up to date  Pneumococcal vaccine status: Up to date  Covid-19 vaccine status: Completed vaccines  Qualifies for Shingles Vaccine? Yes   Zostavax completed Yes   Shingrix Completed?: Yes  Screening Tests Health Maintenance  Topic Date Due   HIV Screening  Never done   Hepatitis C Screening  Never done   COVID-19 Vaccine (4 - Booster for Moderna series) 09/11/2020   INFLUENZA VACCINE  04/11/2022   MAMMOGRAM   05/09/2022   PAP SMEAR-Modifier  01/28/2024   TETANUS/TDAP  02/28/2026   COLONOSCOPY (Pts 45-58yrs Insurance coverage will need to be confirmed)  08/11/2028  Zoster Vaccines- Shingrix  Completed   HPV VACCINES  Aged Out    Health Maintenance  Health Maintenance Due  Topic Date Due   HIV Screening  Never done   Hepatitis C Screening  Never done   COVID-19 Vaccine (4 - Booster for Moderna series) 09/11/2020    Colorectal cancer screening: Type of screening: Colonoscopy. Completed 08/11/2018. Repeat every 10 years  Mammogram status: Completed 05/09/2021. Repeat every year  Bone Density status: Ordered not of age . Pt provided with contact info and advised to call to schedule appt.  Lung Cancer Screening: (Low Dose CT Chest recommended if Age 73-80 years, 30 pack-year currently smoking OR have quit w/in 15years.) does not qualify.   Lung Cancer Screening Referral: n/a   Additional Screening:  Hepatitis C Screening: does not qualify;   Vision Screening: Recommended annual ophthalmology exams for early detection of glaucoma and other disorders of the eye. Is the patient up to date with their annual eye exam?  Yes  Who is the provider or what is the name of the office in which the patient attends annual eye exams? [facility]  If pt is not established with a provider, would they like to be referred to a provider to establish care? No .   Dental Screening: Recommended annual dental exams for proper oral hygiene  Community Resource Referral / Chronic Care Management: CRR required this visit?  No   CCM required this visit?  No      Plan:     I have personally reviewed and noted the following in the patient's chart:   Medical and social history Use of alcohol, tobacco or illicit drugs  Current medications and supplements including opioid prescriptions.  Functional ability and status Nutritional status Physical activity Advanced directives List of other  physicians Hospitalizations, surgeries, and ER visits in previous 12 months Vitals Screenings to include cognitive, depression, and falls Referrals and appointments  In addition, I have reviewed and discussed with patient certain preventive protocols, quality metrics, and best practice recommendations. A written personalized care plan for preventive services as well as general preventive health recommendations were provided to patient.     March Rummage, LPN   [id number]   Nurse Notes: none

## 2022-04-04 ENCOUNTER — Other Ambulatory Visit (INDEPENDENT_AMBULATORY_CARE_PROVIDER_SITE_OTHER): Payer: Medicare Other

## 2022-04-04 DIAGNOSIS — R7303 Prediabetes: Secondary | ICD-10-CM | POA: Diagnosis not present

## 2022-04-04 LAB — HEMOGLOBIN A1C: Hgb A1c MFr Bld: 6.7 % — ABNORMAL HIGH (ref 4.6–6.5)

## 2022-04-06 ENCOUNTER — Other Ambulatory Visit: Payer: Medicare Other

## 2022-04-26 ENCOUNTER — Encounter: Payer: Self-pay | Admitting: Family Medicine

## 2022-04-26 ENCOUNTER — Ambulatory Visit: Payer: Medicare Other | Admitting: Family Medicine

## 2022-04-26 VITALS — BP 146/88 | HR 90 | Temp 98.0°F | Wt 313.4 lb

## 2022-04-26 DIAGNOSIS — E8881 Metabolic syndrome: Secondary | ICD-10-CM

## 2022-04-26 DIAGNOSIS — I493 Ventricular premature depolarization: Secondary | ICD-10-CM

## 2022-04-26 DIAGNOSIS — I10 Essential (primary) hypertension: Secondary | ICD-10-CM

## 2022-04-26 MED ORDER — LISINOPRIL-HYDROCHLOROTHIAZIDE 20-25 MG PO TABS: 1.0000 | ORAL_TABLET | Freq: Every day | ORAL | 3 refills | Status: DC

## 2022-04-26 MED ORDER — METFORMIN HCL 500 MG PO TABS: 500.0000 mg | ORAL_TABLET | Freq: Every day | ORAL | 3 refills | Status: DC

## 2022-04-26 NOTE — Progress Notes (Signed)
[facility] PRIMARY CARE LB PRIMARY Trecia Rogers [facility] [address] [place] [address] Dept: [phone] Dept Fax: [phone]  Chronic Care Office Visit  Subjective:    Patient ID: Connie Mosley, female    DOB: [birth year], 61 y.o..   MRN: [id number]  Chief Complaint  Patient presents with   Follow-up    Blood pressure follow up    History of Present Illness:  Patient is in today for reassessment of chronic medical issues.  Connie Mosley has a history of essential hypertension. She is managed on lisinopril 20 mg daily.    Connie Mosley has a history of palpitations, apparently secondary to frequent PVCs. She is currently managed on metoprolol 25 mg daily. She admits that she continues to get some skip beats.  She notes an upcoming appointment with cardiology.   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 She underwent a splenectomy and has had multiple blood transfusions in the past. She is currently seeing Dr. Leonides Schanz (hematology).  Connie Mosley notes she has a past history of Type 2 diabetes. She was managed on metformin int he past. When her hemolytic anemia developed, her A1c falsely decreased. She was followed with fructosamine levels and eventually her metformin was stopped. She has seen her A1c gradually increase.  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 25 mL 0   famotidine (PEPCID) 40 MG tablet Take 1 tablet (40 mg total) by mouth daily. 90 tablet 3   folic acid (FOLVITE) 1 MG tablet Take 1 tablet (1 mg total) by mouth daily. 90 tablet 5   nabumetone (RELAFEN) 750 MG tablet TAKE 1 TABLET BY MOUTH TWICE A DAY AS NEEDED 60 tablet 3   lisinopril-hydrochlorothiazide (ZESTORETIC) 20-12.5 MG tablet Take 1 tablet by mouth daily. 90 tablet 3   metoprolol tartrate (LOPRESSOR) 25 MG tablet TAKE 1 TABLET (25 MG TOTAL) BY MOUTH 2 (TWO) TIMES DAILY. MAY TAKE ADDITIONAL 25 MG(1TABLET) AS NEEDED PRIOR TO EXERCISE (WITHIN 1 HOUR) 60 tablet 1   No facility-administered medications prior to visit.   Allergies  Allergen Reactions   Cefuroxime Axetil Itching   Sulfamethoxazole-Trimethoprim Other (See Comments)    Reacts with Bone Marrow per Hemotologist    Cefuroxime Hives and Rash    Objective:   Today's Vitals   04/26/22 0811  BP: (!) 146/88  Pulse: 90  Temp: 98 F (36.7 C)  TempSrc: Temporal  SpO2: 94%  Weight: (!) 313 lb 6.4 oz (142.2 kg)   Body mass index is 46.62 kg/m.   General: Well developed, well nourished. No acute distress.  CV: Normal rate with occasional premature beats. Pulses 2+ bilaterally. Psych: Alert and oriented. Normal mood and affect.  Health Maintenance Due  Topic Date Due   HIV Screening  Never done   Hepatitis C Screening  Never done   INFLUENZA VACCINE  04/11/2022   Lab Results    Latest Ref Rng & Units 01/19/2022    2:39 PM 01/19/2021    3:33 PM 01/05/2020   10:17 AM  BMP  Glucose 70 - 99 mg/dL 154  008  676   BUN 8 - 23 mg/dL 9  15  9     Creatinine 0.44 - 1.00 mg/dL  1.95  0.93   Sodium 135 - 145 mmol/L 139  139  140   Potassium 3.5 - 5.1 mmol/L 4.1  4.5  5.0   Chloride 98 - 111 mmol/L 103  105  104   CO2 22 - 32 mmol/L 29  26  28    Calcium 8.9 - 10.3 mg/dL 9.6  9.3  9.6    Lab Results  Component Value Date   HGBA1C 6.7 (H) 04/04/2022     Assessment & Plan:   1. Essential hypertension Connie Mosley blood pressure is increased today. I will increase her Zestoretic strength and plan to reassess this in 3 months.  - lisinopril-hydrochlorothiazide (ZESTORETIC) 20-25 MG tablet; Take 1 tablet by mouth daily.  Dispense: 90 tablet; Refill: 3  2. PVCs (premature ventricular contractions) Still having frequent PVCs by auscultation. I will await cardiology's assessment of this.  3. Insulin resistance I do not have old records confirming a diabetes diagnosis. At this point, she has had a single A1c within a diabetes range. I recommend we start her back on metformin. I will repeat her A1c in 3 months.  - metFORMIN (GLUCOPHAGE) 500 MG tablet; Take 1 tablet (500 mg total) by mouth daily with breakfast.  Dispense: 90 tablet; Refill: 3   Return in about 3 months (around 07/27/2022) for Reassessment.   04/06/2022, MD

## 2022-05-11 ENCOUNTER — Ambulatory Visit
Admission: RE | Admit: 2022-05-11 | Discharge: 2022-05-11 | Disposition: A | Discharge status: Home / Self Care | Payer: Medicare Other | Source: Ambulatory Visit | Attending: Family Medicine | Admitting: Family Medicine

## 2022-05-11 DIAGNOSIS — Z1231 Encounter for screening mammogram for malignant neoplasm of breast: Secondary | ICD-10-CM

## 2022-05-26 ENCOUNTER — Encounter: Payer: Self-pay | Admitting: Family Medicine

## 2022-05-26 ENCOUNTER — Ambulatory Visit (INDEPENDENT_AMBULATORY_CARE_PROVIDER_SITE_OTHER): Payer: Medicare Other

## 2022-05-26 DIAGNOSIS — Z23 Encounter for immunization: Secondary | ICD-10-CM | POA: Diagnosis not present

## 2022-05-26 NOTE — Progress Notes (Cosign Needed Addendum)
After obtaining consent, and per orders of Dr. Veto Kemps, injection of Influenza Vaccien given by Philipp Deputy. Patient tolerated injection well and to report any adverse reaction to me immediately.

## 2022-05-26 NOTE — Progress Notes (Deleted)
Per orders of *** pt is here for Influenza vaccine (regular dose) or (high dose). Pt received influenza vaccine  in *** at *** . Pt tolerated influenza vaccine well.  

## 2022-07-20 ENCOUNTER — Other Ambulatory Visit: Payer: Self-pay | Admitting: Family

## 2022-07-24 NOTE — Telephone Encounter (Signed)
Received call from pt regarding the fact that she is Covid +. Has had a fever of 101. Pt has autoimmune hemolytic anemia. Her previous doctor in [place] had told her that if she got a fever, it could trigger her hemolytic anemia. POt is asking if she needs to take medication for the Covid or not.  TCT patient. No answer. LVM on identified phone that Dr. Leonides Schanz just recommends tylenol every 6 hours for fever. He does not recommend Paxlovid as he is not immuno-compromised. Advised that pt call if her urine turns dark and she becomes more fatigued, to call here @ [phone]

## 2022-07-31 ENCOUNTER — Ambulatory Visit: Payer: Medicare Other | Admitting: Family Medicine

## 2022-08-09 ENCOUNTER — Ambulatory Visit: Payer: Medicare Other | Admitting: Family Medicine

## 2022-08-09 ENCOUNTER — Encounter: Payer: Self-pay | Admitting: Family Medicine

## 2022-08-09 VITALS — BP 118/74 | HR 81 | Temp 97.4°F | Ht 68.75 in | Wt 309.4 lb

## 2022-08-09 DIAGNOSIS — I10 Essential (primary) hypertension: Secondary | ICD-10-CM

## 2022-08-09 DIAGNOSIS — R7303 Prediabetes: Secondary | ICD-10-CM | POA: Diagnosis not present

## 2022-08-09 NOTE — Progress Notes (Signed)
[facility] PRIMARY CARE LB PRIMARY Connie Mosley [facility] [address] [place] [address] Dept: [phone] Dept Fax: [phone]  Chronic Care Office Visit  Subjective:    Patient ID: Connie Mosley, female    DOB: 02/23/1961, 61 y.o..   MRN: [id number]  Chief Complaint  Patient presents with   Follow-up    3 month f/u.  No concerns.      History of Present Illness:  Patient is in today for reassessment of chronic medical issues.  Connie Mosley has a history of essential hypertension. She is managed on Zestoretic 20-25 mg daily. We added the HCTZ component at her last visit.   Connie Mosley notes she has a past history of Type 2 diabetes. She was managed on metformin in the past. When her hemolytic anemia developed, her A1c falsely decreased. She was followed with fructosamine levels and eventually her metformin was stopped. She has seen her A1c gradually increase. At her last visit, her A1c was back up to 6.7%. We started her on metformin.  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 25 mL 0   famotidine (PEPCID) 40 MG tablet Take 1 tablet (40 mg total) by mouth daily. 90 tablet 3   folic acid (FOLVITE) 1 MG tablet Take 1 tablet (1 mg total) by mouth daily. 90 tablet 5   lisinopril-hydrochlorothiazide (ZESTORETIC) 20-25 MG tablet Take 1 tablet by mouth daily. 90 tablet 3   metFORMIN (GLUCOPHAGE) 500 MG tablet Take 1 tablet (500 mg total) by mouth daily with breakfast. 90 tablet 3   metoprolol tartrate (LOPRESSOR) 25 MG tablet TAKE 1 TABLET (25 MG TOTAL) BY MOUTH 2 (TWO) TIMES DAILY. MAY TAKE ADDITIONAL 25 MG(1TABLET) AS NEEDED PRIOR TO EXERCISE (WITHIN 1 HOUR) 60 tablet 1   nabumetone (RELAFEN) 750 MG tablet TAKE 1 TABLET BY MOUTH TWICE A DAY AS NEEDED 60 tablet 3   No facility-administered medications prior to visit.   Allergies  Allergen Reactions   Cefuroxime Axetil Itching   Sulfamethoxazole-Trimethoprim Other (See Comments)    Reacts with Bone Marrow per Hemotologist    Cefuroxime Hives and Rash     Objective:   Today's Vitals   08/09/22 1351  BP: 118/74  Pulse: 81  Temp: (!) 97.4 F (36.3 C)  TempSrc: Temporal  SpO2: 95%  Weight: (!) 309 lb 6.4 oz (140.3 kg)  Height: 5' 8.75" (1.746 m)   Body mass index is 46.02 kg/m.   General: Well developed, well nourished. No acute distress. Psych: Alert and oriented. Normal mood and affect.  Health Maintenance Due  Topic Date Due  HIV Screening  Never done   Hepatitis C Screening  Never done     Assessment & Plan:   1. Essential hypertension Blood pressure is much better now. We will continue Zestoretic 20-25 mg daily.  - Basic metabolic panel  2. Prediabetes I will recheck her A1c today. I will have her continue on metformin 500 mg daily.  - Basic metabolic panel - Hemoglobin A1c  Return in about 3 months (around 11/09/2022) for Reassessment.   Loyola Mast, MD

## 2022-08-10 ENCOUNTER — Telehealth: Payer: Self-pay | Admitting: Family Medicine

## 2022-08-10 ENCOUNTER — Encounter: Payer: Self-pay | Admitting: Family Medicine

## 2022-08-10 LAB — BASIC METABOLIC PANEL
BUN: 14 mg/dL (ref 6–23)
CO2: 29 mEq/L (ref 19–32)
Calcium: 9.6 mg/dL (ref 8.4–10.5)
Chloride: 101 mEq/L (ref 96–112)
Creatinine, Ser: 0.91 mg/dL (ref 0.40–1.20)
GFR: 66.99 mL/min (ref >60.00)
Glucose, Bld: 114 mg/dL — ABNORMAL HIGH (ref 70–99)
Potassium: 4.4 mEq/L (ref 3.5–5.1)
Sodium: 139 mEq/L (ref 135–145)

## 2022-08-10 LAB — HEMOGLOBIN A1C: Hgb A1c MFr Bld: 6.8 % — ABNORMAL HIGH (ref 4.6–6.5)

## 2022-08-10 NOTE — Telephone Encounter (Signed)
Patient notified VIA phone that labs haven't been reviewed yet. Will call her once provider looks at them. Dm/cma

## 2022-08-10 NOTE — Telephone Encounter (Signed)
Caller Name: Bella Brummet Call back phone #: [phone]  Reason for Call: Pt had labs drawn yesterday 11/30, and normally receives her results pretty quickly. They have not been delivered yet, can pt please get a call to go over her labs

## 2022-08-14 NOTE — Telephone Encounter (Signed)
Patient notified VIA phone and had gotten them through my chart.  No questions. Dm/cma

## 2022-08-29 ENCOUNTER — Other Ambulatory Visit: Payer: Self-pay | Admitting: Hematology and Oncology

## 2022-08-29 MED ORDER — CYANOCOBALAMIN 1000 MCG/ML IJ SOLN: 1000.0000 ug | INTRAMUSCULAR | 11 refills | Status: DC

## 2022-11-07 ENCOUNTER — Telehealth: Payer: Self-pay | Admitting: Hematology and Oncology

## 2022-11-07 NOTE — Telephone Encounter (Signed)
Called patient per provider PAL to reschedule 5/13 appointment. Patient rescheduled and notified.

## 2022-11-09 ENCOUNTER — Ambulatory Visit: Payer: Medicare Other | Admitting: Family Medicine

## 2023-01-14 ENCOUNTER — Other Ambulatory Visit: Payer: Self-pay | Admitting: Orthopaedic Surgery

## 2023-01-22 ENCOUNTER — Ambulatory Visit: Payer: Medicare Other | Admitting: Hematology and Oncology

## 2023-01-22 ENCOUNTER — Other Ambulatory Visit: Payer: Medicare Other

## 2023-02-02 ENCOUNTER — Other Ambulatory Visit: Payer: Self-pay | Admitting: Hematology and Oncology

## 2023-02-02 ENCOUNTER — Inpatient Hospital Stay (HOSPITAL_BASED_OUTPATIENT_CLINIC_OR_DEPARTMENT_OTHER): Payer: Medicare Other | Admitting: Hematology and Oncology

## 2023-02-02 ENCOUNTER — Inpatient Hospital Stay: Payer: Medicare Other | Attending: Hematology and Oncology

## 2023-02-02 VITALS — BP 146/114 | HR 84 | Temp 98.6°F | Resp 16 | Wt 312.1 lb

## 2023-02-02 DIAGNOSIS — I10 Essential (primary) hypertension: Secondary | ICD-10-CM | POA: Diagnosis not present

## 2023-02-02 DIAGNOSIS — D591 Autoimmune hemolytic anemia, unspecified: Secondary | ICD-10-CM | POA: Insufficient documentation

## 2023-02-02 DIAGNOSIS — E538 Deficiency of other specified B group vitamins: Secondary | ICD-10-CM | POA: Diagnosis not present

## 2023-02-02 DIAGNOSIS — D5919 Other autoimmune hemolytic anemia: Secondary | ICD-10-CM

## 2023-02-02 LAB — CBC WITH DIFFERENTIAL (CANCER CENTER ONLY)
Abs Immature Granulocytes: 0.02 10*3/uL (ref 0.00–0.07)
Basophils Absolute: 0.2 10*3/uL — ABNORMAL HIGH (ref 0.0–0.1)
Basophils Relative: 2 %
Eosinophils Absolute: 0.3 10*3/uL (ref 0.0–0.5)
Eosinophils Relative: 4 %
HCT: 44.5 % (ref 36.0–46.0)
Hemoglobin: 14.8 g/dL (ref 12.0–15.0)
Immature Granulocytes: 0 %
Lymphocytes Relative: 49 %
Lymphs Abs: 4.6 10*3/uL — ABNORMAL HIGH (ref 0.7–4.0)
MCH: 30 pg (ref 26.0–34.0)
MCHC: 33.3 g/dL (ref 30.0–36.0)
MCV: 90.3 fL (ref 80.0–100.0)
Monocytes Absolute: 0.8 10*3/uL (ref 0.1–1.0)
Monocytes Relative: 9 %
Neutro Abs: 3.3 10*3/uL (ref 1.7–7.7)
Neutrophils Relative %: 36 %
Platelet Count: 310 10*3/uL (ref 150–400)
RBC: 4.93 MIL/uL (ref 3.87–5.11)
RDW: 15 % (ref 11.5–15.5)
WBC Count: 9.2 10*3/uL (ref 4.0–10.5)
nRBC: 0 % (ref 0.0–0.2)

## 2023-02-02 LAB — CMP (CANCER CENTER ONLY)
ALT: 18 U/L (ref 0–44)
AST: 18 U/L (ref 15–41)
Albumin: 4.2 g/dL (ref 3.5–5.0)
Alkaline Phosphatase: 60 U/L (ref 38–126)
Anion gap: 6 (ref 5–15)
BUN: 12 mg/dL (ref 8–23)
CO2: 30 mmol/L (ref 22–32)
Calcium: 9.4 mg/dL (ref 8.9–10.3)
Chloride: 103 mmol/L (ref 98–111)
Creatinine: 0.86 mg/dL (ref 0.44–1.00)
GFR, Estimated: >60 mL/min (ref >60)
Glucose, Bld: 184 mg/dL — ABNORMAL HIGH (ref 70–99)
Potassium: 4.4 mmol/L (ref 3.5–5.1)
Sodium: 139 mmol/L (ref 135–145)
Total Bilirubin: 0.8 mg/dL (ref 0.3–1.2)
Total Protein: 6.8 g/dL (ref 6.5–8.1)

## 2023-02-02 LAB — RETIC PANEL
Immature Retic Fract: 8.2 % (ref 2.3–15.9)
RBC.: 4.93 MIL/uL (ref 3.87–5.11)
Retic Count, Absolute: 82.8 10*3/uL (ref 19.0–186.0)
Retic Ct Pct: 1.7 % (ref 0.4–3.1)
Reticulocyte Hemoglobin: 32.5 pg (ref >27.9)

## 2023-02-02 LAB — FOLATE: Folate: >40 ng/mL (ref >5.9)

## 2023-02-02 LAB — LACTATE DEHYDROGENASE: LDH: 168 U/L (ref 98–192)

## 2023-02-02 LAB — VITAMIN B12: Vitamin B-12: >7500 pg/mL — ABNORMAL HIGH (ref 180–914)

## 2023-02-02 MED ORDER — FOLIC ACID 1 MG PO TABS: 1.0000 mg | ORAL_TABLET | Freq: Every day | ORAL | 5 refills | Status: DC

## 2023-02-02 MED ORDER — CYANOCOBALAMIN 1000 MCG/ML IJ SOLN: 1000.0000 ug | INTRAMUSCULAR | 11 refills | Status: DC

## 2023-02-02 NOTE — Progress Notes (Signed)
[facility] Telephone:([phone]   [phone]  PROGRESS NOTE  Patient Care Team: Loyola Mast, MD as PCP - General (Family Medicine) Parke Poisson, MD as Consulting Physician (Cardiology) Jaci Standard, MD as Consulting Physician (Hematology and Oncology)  Hematological/Oncological History # Autoimmune Hemolytic Anemia 08/16/2020: last visit with Dr. Donia Guiles at [facility] 01/19/2021: establish care with Dr. Leonides Schanz  Interval History:  Connie Mosley 62 y.o. female with medical history significant for autoimmune hemolytic anemia who presents for a follow up visit. The patient's last visit was on 01/19/2022. In the interim since the last visit she has had no new changes in her health.   On exam today Connie Mosley reports she has been well overall in the interim since her last visit 1 year ago.  She has had no major changes in her health with no new medications.  She notes her energy levels are good and the only thing she is currently working on is trying to lose 40 pounds so she can get a knee replacement.  She is trying to decrease her intake while increase her cardiac exercise, however with her knee pain this has been challenging.  She is not having any lightheadedness, dizziness, or shortness of breath.  She reports he continues taking her B12 injections as well as her folate pills without difficulty.  Her blood pressure is elevated today, however she forgot to take her blood pressure medications this morning and also had a blood pressure taken shortly after walking through our clinic.  Overall she feels well with no questions concerns or complaints today.  She denies any dark stools or overt signs of bleeding.  She reports no fevers, chills, sweats, nausea, vomiting or diarrhea.  A full 10 point ROS is listed below.  MEDICAL HISTORY:  Past Medical History:  Diagnosis Date   Hypertension 09/11/2008   Idiopathic autoimmune hemolytic anemia (HCC) 2010   rx  included in past prednisone, retuximab, splenectomy   Irritable bowel syndrome 05/12/2012    SURGICAL HISTORY: Past Surgical History:  Procedure Laterality Date   APPENDECTOMY  1982   SPLENECTOMY  05/2012   TONSILECTOMY/ADENOIDECTOMY WITH MYRINGOTOMY     childhood    TOTAL KNEE ARTHROPLASTY Left 2012    SOCIAL HISTORY: Social History   Socioeconomic History   Marital status: Married    Spouse name: Not on file   Number of children: 3   Years of education: Not on file   Highest education level: Associate degree: occupational, Scientist, product/process development, or vocational program  Occupational History   Occupation: Retired   Tobacco Use   Smoking status: Never   Smokeless tobacco: Never  Vaping Use   Vaping Use: Never used  Substance and Sexual Activity   Alcohol use: Yes    Comment: wine 2 x a week   Drug use: Never   Sexual activity: Yes  Other Topics Concern   Not on file  Social History Narrative   Married   From [place], [place]   Former LPN   Went on disability in 2013 for her autoimmune disorder   Moved to [place] in 2018   Social Determinants of Health   Financial Resource Strain: Low Risk  (03/01/2022)   Overall Financial Resource Strain (CARDIA)    Difficulty of Paying Living Expenses: Not hard at all  Food Insecurity: No Food Insecurity (03/01/2022)   Hunger Vital Sign    Worried About Running Out of Food in the Last Year: Never true  Ran Out of Food in the Last Year: Never true  Transportation Needs: No Transportation Needs (03/01/2022)   PRAPARE - Administrator, Civil Service (Medical): No    Lack of Transportation (Non-Medical): No  Physical Activity: Sufficiently Active (03/01/2022)   Exercise Vital Sign    Days of Exercise per Week: 3 days    Minutes of Exercise per Session: 50 min  Stress: No Stress Concern Present (03/01/2022)   [employer] of Occupational Health - Occupational Stress Questionnaire    Feeling of Stress : Not at all  Social Connections:  Socially Integrated (03/01/2022)   Social Connection and Isolation Panel [NHANES]    Frequency of Communication with Friends and Family: Twice a week    Frequency of Social Gatherings with Friends and Family: Twice a week    Attends Religious Services: More than 4 times per year    Active Member of [employer] or Organizations: Yes    Attends Banker Meetings: More than 4 times per year    Marital Status: Married  Catering manager Violence: Not At Risk (03/01/2022)   Humiliation, Afraid, Rape, and Kick questionnaire    Fear of Current or Ex-Partner: No    Emotionally Abused: No    Physically Abused: No    Sexually Abused: No    FAMILY HISTORY: Family History  Adopted: Yes    ALLERGIES:  is allergic to cefuroxime axetil, sulfamethoxazole-trimethoprim, and cefuroxime.  MEDICATIONS:  Current Outpatient Medications  Medication Sig Dispense Refill   cyanocobalamin (VITAMIN B12) 1000 MCG/ML injection Inject 1 mL (1,000 mcg total) into the muscle every 14 (fourteen) days. 2 mL 11   famotidine (PEPCID) 40 MG tablet Take 1 tablet (40 mg total) by mouth daily. 90 tablet 3   folic acid (FOLVITE) 1 MG tablet Take 1 tablet (1 mg total) by mouth daily. 90 tablet 5   lisinopril-hydrochlorothiazide (ZESTORETIC) 20-25 MG tablet Take 1 tablet by mouth daily. 90 tablet 3   metFORMIN (GLUCOPHAGE) 500 MG tablet Take 1 tablet (500 mg total) by mouth daily with breakfast. 90 tablet 3   metoprolol tartrate (LOPRESSOR) 25 MG tablet TAKE 1 TABLET (25 MG TOTAL) BY MOUTH 2 (TWO) TIMES DAILY. MAY TAKE ADDITIONAL 25 MG(1TABLET) AS NEEDED PRIOR TO EXERCISE (WITHIN 1 HOUR) 60 tablet 1   nabumetone (RELAFEN) 750 MG tablet TAKE 1 TABLET BY MOUTH TWICE A DAY AS NEEDED 60 tablet 3   No current facility-administered medications for this visit.    REVIEW OF SYSTEMS:   Constitutional: ( - ) fevers, ( - )  chills , ( - ) night sweats Eyes: ( - ) blurriness of vision, ( - ) double vision, ( - ) watery eyes Ears,  nose, mouth, throat, and face: ( - ) mucositis, ( - ) sore throat Respiratory: ( - ) cough, ( - ) dyspnea, ( - ) wheezes Cardiovascular: ( - ) palpitation, ( - ) chest discomfort, ( - ) lower extremity swelling Gastrointestinal:  ( - ) nausea, ( - ) heartburn, ( - ) change in bowel habits Skin: ( - ) abnormal skin rashes Lymphatics: ( - ) new lymphadenopathy, ( - ) easy bruising Neurological: ( - ) numbness, ( - ) tingling, ( - ) new weaknesses Behavioral/Psych: ( - ) mood change, ( - ) new changes  All other systems were reviewed with the patient and are negative.  PHYSICAL EXAMINATION:  Vitals:   02/02/23 0957  BP: (!) 146/114  Pulse: 84  Resp: 16  Temp: 98.6 F (37 C)  SpO2: 97%    Filed Weights   02/02/23 0957  Weight: (!) 312 lb 1.6 oz (141.6 kg)     GENERAL: Well-appearing middle-age Caucasian female, alert, no distress and comfortable SKIN: skin color, texture, turgor are normal, no rashes or significant lesions EYES: conjunctiva are pink and non-injected, sclera clear LUNGS: clear to auscultation and percussion with normal breathing effort HEART: regular rate & rhythm and no murmurs and no lower extremity edema Musculoskeletal: no cyanosis of digits and no clubbing  PSYCH: alert & oriented x 3, fluent speech NEURO: no focal motor/sensory deficits  LABORATORY DATA:  I have reviewed the data as listed    Latest Ref Rng & Units 02/02/2023    9:36 AM 01/19/2022    2:39 PM 01/19/2021    3:33 PM  CBC  WBC 4.0 - 10.5 K/uL 9.2  10.2  11.2   Hemoglobin 12.0 - 15.0 g/dL 16.1  09.6  04.5   Hematocrit 36.0 - 46.0 % 44.5  47.0  47.2   Platelets 150 - 400 K/uL 310  303  255        Latest Ref Rng & Units 08/09/2022    2:48 PM 01/19/2022    2:39 PM 01/19/2021    3:33 PM  CMP  Glucose 70 - 99 mg/dL 409  811  914   BUN 6 - 23 mg/dL 14  9  15    Creatinine 0.40 - 1.20 mg/dL 7.82  9.56  2.13   Sodium 135 - 145 mEq/L 139  139  139   Potassium 3.5 - 5.1 mEq/L 4.4  4.1  4.5    Chloride 96 - 112 mEq/L 101  103  105   CO2 19 - 32 mEq/L 29  29  26    Calcium 8.4 - 10.5 mg/dL 9.6  9.6  9.3   Total Protein 6.5 - 8.1 g/dL  7.6  6.8   Total Bilirubin 0.3 - 1.2 mg/dL  0.7  0.6   Alkaline Phos 38 - 126 U/L  78  72   AST 15 - 41 U/L  16  17   ALT 0 - 44 U/L  14  17     No results found for: "MPROTEIN" No results found for: "KPAFRELGTCHN", "LAMBDASER", "KAPLAMBRATIO"   RADIOGRAPHIC STUDIES: No results found.  ASSESSMENT & PLAN Connie Mosley 62 y.o. female with medical history significant for autoimmune hemolytic anemia who presents for a follow up visit.   After review of the labs, review of the records, and discussion with the patient, the findings are most consistent with a well-controlled autoimmune hemolytic anemia.  She has been stable since 2017.  She was last seen at [facility] in December 2021 at which time her history was summarized as having undergone rituximab, cyclophosphamide, splenectomy, and finally a long steroid taper which put her into a durable remission in 2017.  She presents today because she would like to have a local hematologist as a go to in the event there were further issues.   Previously I discussed with her that given her stable hemoglobin I be happy to see her once a year for check at and on an as-needed basis if she were to develop any new or worsening symptoms.  She voiced understanding of this plan moving forward and noted she would be vigilant for the symptoms that would imply she is having an episode of hemolytic anemia.   # Autoimmune Hemolytic Anemia --today will  order new baseline labs to include CBC, CMP, LDH, haptoglobin -- Based on prior labs and records the patient has had stable hemoglobin since 2017. --No indication for treatment at this time, we will continue with observation. --Labs today show white blood cell count 9.2, hemoglobin 14.8, MCV 90.3, and platelets of 310 --Strict return precautions for  patient if she were to notice darkening of her urine, fatigue, or other concerning signs or symptoms. --RTC in 1 years time or sooner if indicated by labs/symptoms.   #Vitamin B12 Deficiency #Folic Acid supplementation -- Patient is currently on vitamin B12 and folic acid for her hemolytic anemia.  Today I will refill her folic acid 1 mg p.o. daily as well as her vitamin B12 1000 mcg subcu every 2 weeks.  No orders of the defined types were placed in this encounter.   All questions were answered. The patient knows to call the clinic with any problems, questions or concerns.  A total of more than 25 minutes were spent on this encounter with face-to-face time and non-face-to-face time, including preparing to see the patient, ordering tests and/or medications, counseling the patient and coordination of care as outlined above.   Ulysees Barns, MD Department of Hematology/Oncology [facility] at [facility] Phone: [phone] Pager: [phone] Email: [email or web address]  02/02/2023 10:28 AM

## 2023-02-04 ENCOUNTER — Encounter: Payer: Self-pay | Admitting: Hematology and Oncology

## 2023-02-04 LAB — HAPTOGLOBIN: Haptoglobin: 58 mg/dL (ref 37–355)

## 2023-02-06 ENCOUNTER — Telehealth: Payer: Self-pay | Admitting: Family Medicine

## 2023-02-06 NOTE — Telephone Encounter (Signed)
Noted. Dm/cma  

## 2023-02-06 NOTE — Telephone Encounter (Signed)
Caller Name: Summers Miura Ph #: [phone] Chief Complaint: Pt having a BP spike last 5 to 6 days. It's been 170/101.   This call was transferred to Nurse Triage/Access Nurse. This is for documentation purposes. No follow up required at this time.

## 2023-02-07 ENCOUNTER — Ambulatory Visit: Payer: Medicare Other | Admitting: Nurse Practitioner

## 2023-02-07 ENCOUNTER — Encounter: Payer: Self-pay | Admitting: Hematology and Oncology

## 2023-02-07 ENCOUNTER — Encounter: Payer: Self-pay | Admitting: Nurse Practitioner

## 2023-02-07 VITALS — BP 124/80 | HR 79 | Temp 97.2°F | Ht 68.75 in | Wt 310.0 lb

## 2023-02-07 DIAGNOSIS — I10 Essential (primary) hypertension: Secondary | ICD-10-CM

## 2023-02-07 MED ORDER — DICLOFENAC SODIUM 75 MG PO TBEC: 75.0000 mg | DELAYED_RELEASE_TABLET | Freq: Two times a day (BID) | ORAL | 1 refills | Status: DC

## 2023-02-07 NOTE — Progress Notes (Signed)
Acute Office Visit  Subjective:     Patient ID: Connie Mosley, female    DOB: 08-27-1961, 62 y.o.   MRN: [id number]  Chief Complaint  Patient presents with   Hypertension    Concerns with elevated B/P, 146/114 elevated on Friday    HPI Patient is in today for elevated blood pressure readings since Thursday. She states that she didn't feel good on Thursday and checked her blood pressure and it was 160/88. Then on Friday, at the [facility], her blood pressure was 146/114. Over the weekend it ranged from 180-190/110s and this morning it was 152/80. She endorses a slight headaches. She denies chest pain and shortness of breath. The only change in medications recently, is that she has been taking nabumetone twice a day for the prior 5 days when she was taking it about once a week as she has had more right knee pain. She has not changed her diet and has been trying to exercise more.   ROS See pertinent positives and negatives per HPI.     Objective:    BP 124/80   Pulse 79   Temp (!) 97.2 F (36.2 C)   Ht 5' 8.75" (1.746 m)   Wt (!) 310 lb (140.6 kg)   LMP 01/04/2010   SpO2 96%   BMI 46.11 kg/m  BP Readings from Last 3 Encounters:  02/07/23 124/80  02/02/23 (!) 146/114  08/09/22 118/74   Wt Readings from Last 3 Encounters:  02/07/23 (!) 310 lb (140.6 kg)  02/02/23 (!) 312 lb 1.6 oz (141.6 kg)  08/09/22 (!) 309 lb 6.4 oz (140.3 kg)      Physical Exam Vitals and nursing note reviewed.  Constitutional:      General: She is not in acute distress.    Appearance: Normal appearance.  HENT:     Head: Normocephalic.  Eyes:     Conjunctiva/sclera: Conjunctivae normal.  Cardiovascular:     Rate and Rhythm: Normal rate and regular rhythm.     Pulses: Normal pulses.     Heart sounds: Normal heart sounds.  Pulmonary:     Effort: Pulmonary effort is normal.     Breath sounds: Normal breath sounds.  Musculoskeletal:     Cervical back: Normal range of motion.  Skin:     General: Skin is warm.  Neurological:     General: No focal deficit present.     Mental Status: She is alert and oriented to person, place, and time.  Psychiatric:        Mood and Affect: Mood normal.        Behavior: Behavior normal.        Thought Content: Thought content normal.        Judgment: Judgment normal.     No results found for any visits on 02/07/23.      Assessment & Plan:   Problem List Items Addressed This Visit       Cardiovascular and Mediastinum   Essential hypertension - Primary    Chronic, fluctuating.  Her blood pressure was elevated starting Thursday until today.  She states that she was taking her nabumetone more frequently due to increased knee pain.  Blood pressure was most likely elevated due to taking this NSAID.  Will have her stop this and switch to diclofenac 75 mg twice a day as needed.  Encouraged her to continue monitoring her blood pressure to make sure it does not spike with this medication as well.  Blood pressure  today is 124/80.  Continue lisinopril-hydrochlorothiazide 20-25 mg daily.  Recent CMP, CBC reviewed from 02/02/2023.  Keep appointment with PCP next week.       Meds ordered this encounter  Medications   diclofenac (VOLTAREN) 75 MG EC tablet    Sig: Take 1 tablet (75 mg total) by mouth 2 (two) times daily.    Dispense:  60 tablet    Refill:  1    Return if symptoms worsen or fail to improve.  Gerre Scull, NP

## 2023-02-07 NOTE — Patient Instructions (Signed)
It was great to see you!  Stop the nabumetone and start diclofenac twice a day as needed for pain. Keep an eye on your blood pressure while taking this medication.   Keep your follow-up with Dr. Veto Kemps next week.  Take care,  Rodman Pickle, NP

## 2023-02-07 NOTE — Assessment & Plan Note (Signed)
Chronic, fluctuating.  Her blood pressure was elevated starting Thursday until today.  She states that she was taking her nabumetone more frequently due to increased knee pain.  Blood pressure was most likely elevated due to taking this NSAID.  Will have her stop this and switch to diclofenac 75 mg twice a day as needed.  Encouraged her to continue monitoring her blood pressure to make sure it does not spike with this medication as well.  Blood pressure today is 124/80.  Continue lisinopril-hydrochlorothiazide 20-25 mg daily.  Recent CMP, CBC reviewed from 02/02/2023.  Keep appointment with PCP next week.

## 2023-02-10 ENCOUNTER — Other Ambulatory Visit: Payer: Self-pay | Admitting: Family Medicine

## 2023-02-10 DIAGNOSIS — I10 Essential (primary) hypertension: Secondary | ICD-10-CM

## 2023-02-10 DIAGNOSIS — E88819 Insulin resistance, unspecified: Secondary | ICD-10-CM

## 2023-02-12 ENCOUNTER — Telehealth: Payer: Self-pay | Admitting: Hematology and Oncology

## 2023-02-13 ENCOUNTER — Ambulatory Visit: Payer: Medicare Other | Admitting: Family Medicine

## 2023-02-13 ENCOUNTER — Encounter: Payer: Self-pay | Admitting: Family Medicine

## 2023-02-13 VITALS — BP 134/80 | HR 76 | Temp 98.2°F | Ht 68.75 in | Wt 313.2 lb

## 2023-02-13 DIAGNOSIS — Z7984 Long term (current) use of oral hypoglycemic drugs: Secondary | ICD-10-CM

## 2023-02-13 DIAGNOSIS — E1142 Type 2 diabetes mellitus with diabetic polyneuropathy: Secondary | ICD-10-CM

## 2023-02-13 DIAGNOSIS — M2041 Other hammer toe(s) (acquired), right foot: Secondary | ICD-10-CM | POA: Diagnosis not present

## 2023-02-13 DIAGNOSIS — M1711 Unilateral primary osteoarthritis, right knee: Secondary | ICD-10-CM

## 2023-02-13 DIAGNOSIS — I10 Essential (primary) hypertension: Secondary | ICD-10-CM | POA: Diagnosis not present

## 2023-02-13 DIAGNOSIS — Z6841 Body Mass Index (BMI) 40.0 and over, adult: Secondary | ICD-10-CM

## 2023-02-13 DIAGNOSIS — M2042 Other hammer toe(s) (acquired), left foot: Secondary | ICD-10-CM

## 2023-02-13 LAB — URINALYSIS, ROUTINE W REFLEX MICROSCOPIC
Bilirubin Urine: NEGATIVE
Ketones, ur: NEGATIVE
Nitrite: NEGATIVE
RBC / HPF: NONE SEEN (ref 0–?)
Specific Gravity, Urine: 1.02 (ref 1.000–1.030)
Total Protein, Urine: NEGATIVE
Urine Glucose: NEGATIVE
Urobilinogen, UA: 4 — AB (ref 0.0–1.0)
pH: 6.5 (ref 5.0–8.0)

## 2023-02-13 LAB — BASIC METABOLIC PANEL
BUN: 16 mg/dL (ref 6–23)
CO2: 28 mEq/L (ref 19–32)
Calcium: 9.3 mg/dL (ref 8.4–10.5)
Chloride: 103 mEq/L (ref 96–112)
Creatinine, Ser: 0.86 mg/dL (ref 0.40–1.20)
GFR: 71.43 mL/min (ref >60.00)
Glucose, Bld: 134 mg/dL — ABNORMAL HIGH (ref 70–99)
Potassium: 4.3 mEq/L (ref 3.5–5.1)
Sodium: 139 mEq/L (ref 135–145)

## 2023-02-13 LAB — LIPID PANEL
Cholesterol: 193 mg/dL (ref 0–200)
HDL: 52.7 mg/dL (ref >39.00)
LDL Cholesterol: 119 mg/dL — ABNORMAL HIGH (ref 0–99)
NonHDL: 140.62
Total CHOL/HDL Ratio: 4
Triglycerides: 110 mg/dL (ref 0.0–149.0)
VLDL: 22 mg/dL (ref 0.0–40.0)

## 2023-02-13 LAB — MICROALBUMIN / CREATININE URINE RATIO
Creatinine,U: 146.6 mg/dL
Microalb Creat Ratio: 1.5 mg/g (ref 0.0–30.0)
Microalb, Ur: 2.2 mg/dL — ABNORMAL HIGH (ref 0.0–1.9)

## 2023-02-13 LAB — HEMOGLOBIN A1C: Hgb A1c MFr Bld: 6.5 % (ref 4.6–6.5)

## 2023-02-13 NOTE — Progress Notes (Signed)
[facility] Trecia Rogers [facility] [address] [place] [address] Dept: [phone] Dept Fax: [phone]  Chronic Care Office Visit  Subjective:    Patient ID: Connie Mosley, female    DOB: [birth year], 62 y.o..   MRN: [id number]  Chief Complaint  Patient presents with   Medical Management of Chronic Issues    F/u HTN.    History of Present Illness:  Patient is in today for reassessment of chronic medical issues.  Ms. Plimpton has a history of essential hypertension. She is managed on Zestoretic 20-25 mg daily. She had a recent episode of a surge in her blood pressure. She was seen last week by Ms. McElwee. She switched from nabumetone to diclofenac. She has noted her blood pressures are improved.  Ms. Kingston has a history of right knee osteoarthritis. Orthopedics has recommended she have a knee joint replacement, but wants her to lose 40 lbs before proceeding with surgery. Ms. Colbaugh has been struggling with her diet and exercise and not seeing the weight loss she would like.   Ms. Graper has Type 2 diabetes. There was a time when she was off of medication, however, her last two A1cs confirm the presence of diabetes. She is managed on metformin 500 mg daily.   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 2 mL 11   diclofenac (VOLTAREN) 75 MG EC tablet Take 1 tablet (75 mg total) by mouth 2 (two) times daily. 60 tablet 1   famotidine (PEPCID) 40 MG tablet Take 1 tablet (40 mg total) by mouth daily. 90 tablet 3   folic acid (FOLVITE) 1 MG tablet Take 1 tablet (1 mg total) by mouth daily. 90 tablet 5   lisinopril-hydrochlorothiazide (ZESTORETIC) 20-25 MG tablet TAKE 1 TABLET BY MOUTH EVERY DAY 90 tablet 3   metFORMIN (GLUCOPHAGE) 500 MG tablet TAKE 1 TABLET BY MOUTH EVERY DAY WITH BREAKFAST 90 tablet 3   metoprolol tartrate (LOPRESSOR) 25 MG tablet TAKE 1 TABLET (25 MG TOTAL) BY MOUTH 2 (TWO) TIMES DAILY. MAY TAKE ADDITIONAL 25 MG(1TABLET) AS NEEDED PRIOR TO EXERCISE (WITHIN 1 HOUR) 60 tablet 1   No facility-administered medications prior to visit.   Allergies  Allergen Reactions   Cefuroxime Axetil Itching   Sulfamethoxazole-Trimethoprim Other (See Comments)    Reacts with Bone Marrow per Hemotologist    Cefuroxime Hives and Rash   Objective:   Today's Vitals   02/13/23 0751  BP: 134/80  Pulse: 76  Temp: 98.2  F (36.8 C)  TempSrc: Temporal  SpO2: 97%  Weight: (!) 313 lb 3.2 oz (142.1 kg)  Height: 5' 8.75" (1.746 m)   Body mass index is 46.59 kg/m.   General: Well developed, well nourished. No acute distress. Feet- Skin intact. No sign of maceration between toes. Nails are normal. Dorsalis pedis and posterior   tibial artery pulses are normal. There are hammer toe deformities oft he toes on the left side. 5.07  monofilament testing shows some areas of lateral sensory loss  in both feet. Tehre are several stucco   keratoses on the right foot. Psych: Alert and oriented. Normal mood and affect.  Health Maintenance Due  Topic Date Due   OPHTHALMOLOGY EXAM  Never done   HIV Screening  Never done   Diabetic kidney evaluation - Urine ACR  Never done   Hepatitis C Screening  Never done   HEMOGLOBIN A1C  02/07/2023   Medicare Annual Wellness (AWV)  03/02/2023     Assessment & Plan:   Problem List Items Addressed This Visit       Cardiovascular and Mediastinum   Essential hypertension    Blood pressure is in adequate control. Continue lisinopril-HCTZ (Zestoretic) 20-25 mg daily.         Endocrine   Type 2 diabetes mellitus with neurological complications [facility]) - Primary    Ms. Morreale labs do confirm Type 2 diabetes. We did foot screening today. I recommend she see an eye doctor for retinopathy screening. I will check annual DM labs today. I will have her continue metformin 500 mg daily.      Diabetic polyneuropathy (HCC)    We will focus on diabetes control.        Musculoskeletal and Integument   Unilateral primary osteoarthritis, right knee    Stable. She is currently on diclofenac for knee pain. If blood pressure issues worsen, we may need to switch to a mild opioid. Ultimately knee joint replacement is needed.      Hammer toes, bilateral    Patient should consider correction of this due to risk of plantar ulcers from pressures on the MTP joints. She wants to think about this.        Other   Morbid obesity with BMI of 45.0-49.9, adult (HCC)    I recommend we refer her to [facility] to try and assist her with the weight loss she needs to proceed with knee surgery.      Relevant Orders   Amb Ref to Medical Weight Management    Return in about 3 months (around 05/16/2023) for Reassessment.   Loyola Mast, MD

## 2023-02-13 NOTE — Assessment & Plan Note (Signed)
Blood pressure is in adequate control. Continue lisinopril-HCTZ (Zestoretic) 20-25 mg daily.

## 2023-02-13 NOTE — Assessment & Plan Note (Signed)
I recommend we refer her to [facility] to try and assist her with the weight loss she needs to proceed with knee surgery.

## 2023-02-13 NOTE — Assessment & Plan Note (Signed)
Ms. Hok labs do confirm Type 2 diabetes. We did foot screening today. I recommend she see an eye doctor for retinopathy screening. I will check annual DM labs today. I will have her continue metformin 500 mg daily.

## 2023-02-13 NOTE — Assessment & Plan Note (Addendum)
Stable. She is currently on diclofenac for knee pain. If blood pressure issues worsen, we may need to switch to a mild opioid. Ultimately knee joint replacement is needed.

## 2023-02-13 NOTE — Assessment & Plan Note (Signed)
We will focus on diabetes control.

## 2023-02-13 NOTE — Assessment & Plan Note (Signed)
Patient should consider correction of this due to risk of plantar ulcers from pressures on the MTP joints. She wants to think about this.

## 2023-02-18 ENCOUNTER — Other Ambulatory Visit: Payer: Self-pay | Admitting: Family Medicine

## 2023-02-18 DIAGNOSIS — K219 Gastro-esophageal reflux disease without esophagitis: Secondary | ICD-10-CM

## 2023-02-19 ENCOUNTER — Encounter: Payer: Self-pay | Admitting: Family Medicine

## 2023-02-19 DIAGNOSIS — N3000 Acute cystitis without hematuria: Secondary | ICD-10-CM

## 2023-02-19 MED ORDER — NITROFURANTOIN MONOHYD MACRO 100 MG PO CAPS
100.0000 mg | ORAL_CAPSULE | Freq: Two times a day (BID) | ORAL | 0 refills | Status: DC
Start: 2023-02-19 — End: 2023-04-24

## 2023-03-05 ENCOUNTER — Ambulatory Visit (INDEPENDENT_AMBULATORY_CARE_PROVIDER_SITE_OTHER): Payer: Medicare Other

## 2023-03-05 VITALS — Ht 69.0 in | Wt 300.0 lb

## 2023-03-05 DIAGNOSIS — Z0000 Encounter for general adult medical examination without abnormal findings: Secondary | ICD-10-CM

## 2023-03-05 NOTE — Progress Notes (Signed)
Subjective:   Connie Mosley is a 62 y.o. female who presents for Medicare Annual (Subsequent) preventive examination.  Visit Complete: Virtual  I connected with  Suan Esbenshade on 03/05/23 by a audio enabled telemedicine application and verified that I am speaking with the correct person using two identifiers.  Patient Location: Home  Provider Location: Office/Clinic  I discussed the limitations of evaluation and management by telemedicine. The patient expressed understanding and agreed to proceed.  Patient Medicare AWV questionnaire was completed by the patient on 03/01/2023; I have confirmed that all information answered by patient is correct and no changes since this date.  Review of Systems     Cardiac Risk Factors include: hypertension;diabetes mellitus;obesity (BMI >30kg/m2)     Objective:    Today's Vitals   03/05/23 1407  Weight: 300 lb (136.1 kg)  Height: 5\' 9"  (1.753 m)   Body mass index is 44.3 kg/m.     03/05/2023    2:11 PM 03/01/2022    9:22 AM 02/03/2021    1:50 PM 01/05/2020    9:22 AM  Advanced Directives  Does Patient Have a Medical Advance Directive? No No Yes No  Type of Advance Directive   Healthcare Power of Attorney   Would patient like information on creating a medical advance directive?  No - Patient declined  Yes (MAU/Ambulatory/Procedural Areas - Information given)    Current Medications (verified) Outpatient Encounter Medications as of 03/05/2023  Medication Sig   cyanocobalamin (VITAMIN B12) 1000 MCG/ML injection Inject 1 mL (1,000 mcg total) into the muscle every 14 (fourteen) days.   diclofenac (VOLTAREN) 75 MG EC tablet Take 1 tablet (75 mg total) by mouth 2 (two) times daily.   famotidine (PEPCID) 40 MG tablet TAKE 1 TABLET BY MOUTH EVERY DAY   folic acid (FOLVITE) 1 MG tablet Take 1 tablet (1 mg total) by mouth daily.   lisinopril-hydrochlorothiazide (ZESTORETIC) 20-25 MG tablet TAKE 1 TABLET BY MOUTH EVERY DAY   metFORMIN (GLUCOPHAGE)  500 MG tablet TAKE 1 TABLET BY MOUTH EVERY DAY WITH BREAKFAST   metoprolol tartrate (LOPRESSOR) 25 MG tablet TAKE 1 TABLET (25 MG TOTAL) BY MOUTH 2 (TWO) TIMES DAILY. MAY TAKE ADDITIONAL 25 MG(1TABLET) AS NEEDED PRIOR TO EXERCISE (WITHIN 1 HOUR)   nitrofurantoin, macrocrystal-monohydrate, (MACROBID) 100 MG capsule Take 1 capsule (100 mg total) by mouth 2 (two) times daily.   No facility-administered encounter medications on file as of 03/05/2023.    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   TONSILECTOMY/ADENOIDECTOMY WITH MYRINGOTOMY     childhood    TOTAL KNEE ARTHROPLASTY Left 2012   Family History  Adopted: Yes   Social History   Socioeconomic History   Marital status: Married    Spouse name: Not on file   Number of children: 3   Years of education: Not on file   Highest education level: Associate degree: occupational, Scientist, product/process development, or vocational program  Occupational History   Occupation: Retired   Tobacco Use   Smoking status: Never   Smokeless tobacco: Never  Vaping Use   Vaping Use: Never used  Substance and Sexual Activity   Alcohol use: Yes    Comment: wine 2  x a week   Drug use: Never   Sexual activity: Yes  Other Topics Concern   Not on file  Social History Narrative   Married   From [place], [place]   Former LPN   Went on disability in 2013 for her autoimmune disorder   Moved to [place] in 2018   Social Determinants of Health   Financial Resource Strain: Patient Declined (03/01/2023)   Overall Financial Resource Strain (CARDIA)    Difficulty of Paying Living Expenses: Patient declined  Food Insecurity:  No Food Insecurity (03/01/2023)   Hunger Vital Sign    Worried About Running Out of Food in the Last Year: Never true    Ran Out of Food in the Last Year: Never true  Transportation Needs: Patient Declined (03/01/2023)   PRAPARE - Administrator, Civil Service (Medical): Patient declined    Lack of Transportation (Non-Medical): Patient declined  Physical Activity: Insufficiently Active (03/01/2023)   Exercise Vital Sign    Days of Exercise per Week: 3 days    Minutes of Exercise per Session: 20 min  Stress: Patient Declined (03/01/2023)   [employer] of Occupational Health - Occupational Stress Questionnaire    Feeling of Stress : Patient declined  Social Connections: Unknown (03/01/2023)   Social Connection and Isolation Panel [NHANES]    Frequency of Communication with Friends and Family: More than three times a week    Frequency of Social Gatherings with Friends and Family: More than three times a week    Attends Religious Services: Patient declined    Database administrator or Organizations: Yes    Attends Engineer, structural: More than 4 times per year    Marital Status: Married    Tobacco Counseling Counseling given: Not Answered   Clinical Intake:  Pre-visit preparation completed: Yes  Pain : No/denies pain     Nutritional Status: BMI > 30  Obese Nutritional Risks: None Diabetes: Yes CBG done?: No Did pt. bring in CBG monitor from home?: No  How often do you need to have someone help you when you read instructions, pamphlets, or other written materials from your doctor or pharmacy?: 1 - Never  Interpreter Needed?: No  Information entered by :: NAllen LPN   Activities of Daily Living    03/01/2023    9:34 AM  In your present state of health, do you have any difficulty performing the following activities:  Hearing? 0  Vision? 0  Difficulty concentrating or making decisions? 0  Walking or climbing stairs? 0  Dressing or bathing? 0   Doing errands, shopping? 0  Preparing Food and eating ? N  Using the Toilet? N  In the past six months, have you accidently leaked urine? Y  Do you have problems with loss of bowel control? N  Managing your Medications? N  Managing your Finances? N  Housekeeping or managing your Housekeeping? N    Patient Care Team: Loyola Mast, MD as PCP - General (Family Medicine) Parke Poisson, MD as Consulting Physician (Cardiology) Jaci Standard, MD as Consulting Physician (Hematology and Oncology)  Indicate any recent Medical Services you may have received from other than [facility] providers in the past year (date may be approximate).     Assessment:   This is a routine wellness examination for Shaquana.  Hearing/Vision screen Vision Screening - Comments:: Regular eye exams, [facility]  Dietary issues and exercise activities discussed:     Goals Addressed  This Visit's Progress    Patient Stated       03/05/2023, wants to lose weight       Depression Screen    03/05/2023    2:12 PM 02/13/2023    7:57 AM 03/01/2022    9:22 AM 03/01/2022    9:20 AM 01/17/2022    8:14 AM 02/03/2021    1:47 PM 01/27/2021    1:01 PM  PHQ 2/9 Scores  PHQ - 2 Score 0 0 0 0 0 1 0    Fall Risk    03/01/2023    9:34 AM 02/13/2023    7:56 AM 02/07/2023   11:31 AM 04/26/2022    8:12 AM 03/01/2022    9:24 AM  Fall Risk   Falls in the past year? 0 0 0 0 0  Number falls in past yr: 0 0 0 0 0  Injury with Fall? 0 0  0 0  Risk for fall due to : Medication side effect No Fall Risks No Fall Risks    Follow up Falls prevention discussed;Education provided;Falls evaluation completed Falls evaluation completed Falls evaluation completed  Falls evaluation completed;Education provided    MEDICARE RISK AT HOME:   TIMED UP AND GO:  Was the test performed?  No    Cognitive Function:        03/05/2023    2:12 PM 02/03/2021    1:55 PM 01/05/2020    9:23 AM  6CIT Screen  What Year? 0  points 0 points 0 points  What month? 0 points 0 points 0 points  What time? 0 points 0 points 0 points  Count back from 20 0 points 0 points 0 points  Months in reverse 0 points 0 points 0 points  Repeat phrase 2 points 0 points 0 points  Total Score 2 points 0 points 0 points    Immunizations Immunization History  Administered Date(s) Administered   Influenza Split 07/30/2015   Influenza,inj,Quad PF,6+ Mos 06/12/2016, 06/25/2017, 06/25/2018, 06/17/2019, 07/28/2021, 05/26/2022   Meningococcal B Recombinant 10/17/2016   Meningococcal B, OMV 10/17/2016, 12/22/2016   Meningococcal Conjugate 10/17/2016, 12/22/2016   Moderna Sars-Covid-2 Vaccination 11/28/2019, 12/27/2019, 07/17/2020   Pneumococcal Conjugate-13 10/17/2016   Pneumococcal Polysaccharide-23 12/21/2016   Td 08/27/2006   Tdap 02/29/2016   Zoster Recombinat (Shingrix) 01/28/2021, 01/17/2022    TDAP status: Up to date  Flu Vaccine status: Up to date  Pneumococcal vaccine status: Up to date  Covid-19 vaccine status: Completed vaccines  Qualifies for Shingles Vaccine? Yes   Zostavax completed Yes   Shingrix Completed?: Yes  Screening Tests Health Maintenance  Topic Date Due   OPHTHALMOLOGY EXAM  Never done   HIV Screening  Never done   Hepatitis C Screening  Never done   COVID-19 Vaccine (4 - 2023-24 season) 05/12/2022   Medicare Annual Wellness (AWV)  03/02/2023   INFLUENZA VACCINE  04/12/2023   MAMMOGRAM  05/12/2023   HEMOGLOBIN A1C  08/15/2023   PAP SMEAR-Modifier  01/28/2024   Diabetic kidney evaluation - eGFR measurement  02/13/2024   Diabetic kidney evaluation - Urine ACR  02/13/2024   FOOT EXAM  02/13/2024   DTaP/Tdap/Td (3 - Td or Tdap) 02/28/2026   Colonoscopy  08/11/2028   Zoster Vaccines- Shingrix  Completed   HPV VACCINES  Aged Out    Health Maintenance  Health Maintenance Due  Topic Date Due   OPHTHALMOLOGY EXAM  Never done   HIV Screening  Never done   Hepatitis C Screening  Never done  COVID-19 Vaccine (4 - 2023-24 season) 05/12/2022   Medicare Annual Wellness (AWV)  03/02/2023    Colorectal cancer screening: Type of screening: Colonoscopy. Completed 08/11/2018. Repeat every 10 years  Mammogram status: Completed 05/11/2022. Repeat every year  Bone Density status: n/a  Lung Cancer Screening: (Low Dose CT Chest recommended if Age 64-80 years, 20 pack-year currently smoking OR have quit w/in 15years.) does not qualify.   Lung Cancer Screening Referral: no  Additional Screening:  Hepatitis C Screening: does qualify;   Vision Screening: Recommended annual ophthalmology exams for early detection of glaucoma and other disorders of the eye. Is the patient up to date with their annual eye exam?  Yes  Who is the provider or what is the name of the office in which the patient attends annual eye exams? [facility] If pt is not established with a provider, would they like to be referred to a provider to establish care? No .   Dental Screening: Recommended annual dental exams for proper oral hygiene  Diabetic Foot Exam: Diabetic Foot Exam: Completed 02/13/2023  Community Resource Referral / Chronic Care Management: CRR required this visit?  No   CCM required this visit?  No     Plan:     I have personally reviewed and noted the following in the patient's chart:   Medical and social history Use of alcohol, tobacco or illicit drugs  Current medications and supplements including opioid prescriptions. Patient is not currently taking opioid prescriptions. Functional ability and status Nutritional status Physical activity Advanced directives List of other physicians Hospitalizations, surgeries, and ER visits in previous 12 months Vitals Screenings to include cognitive, depression, and falls Referrals and appointments  In addition, I have reviewed and discussed with patient certain preventive protocols, quality metrics, and best practice recommendations. A  written personalized care plan for preventive services as well as general preventive health recommendations were provided to patient.     Barb Merino, LPN   12/18/8117   After Visit Summary: (MyChart) Due to this being a telephonic visit, the after visit summary with patients personalized plan was offered to patient via MyChart   Nurse Notes: none

## 2023-03-05 NOTE — Patient Instructions (Signed)
Connie Mosley , Thank you for taking time to come for your Medicare Wellness Visit. I appreciate your ongoing commitment to your health goals. Please review the following plan we discussed and let me know if I can assist you in the future.   These are the goals we discussed:  Goals      Patient Stated     Lose weight      Patient Stated     03/05/2023, wants to lose weight        This is a list of the screening recommended for you and due dates:  Health Maintenance  Topic Date Due   Eye exam for diabetics  Never done   HIV Screening  Never done   Hepatitis C Screening  Never done   COVID-19 Vaccine (4 - 2023-24 season) 05/12/2022   Flu Shot  04/12/2023   Mammogram  05/12/2023   Hemoglobin A1C  08/15/2023   Pap Smear  01/28/2024   Yearly kidney function blood test for diabetes  02/13/2024   Yearly kidney health urinalysis for diabetes  02/13/2024   Complete foot exam   02/13/2024   Medicare Annual Wellness Visit  03/04/2024   DTaP/Tdap/Td vaccine (3 - Td or Tdap) 02/28/2026   Colon Cancer Screening  08/11/2028   Zoster (Shingles) Vaccine  Completed   HPV Vaccine  Aged Out    Advanced directives: Advance directive discussed with you today.   Conditions/risks identified: none  Next appointment: Follow up in one year for your annual wellness visit.   Preventive Care 40-64 Years, Female Preventive care refers to lifestyle choices and visits with your health care provider that can promote health and wellness. What does preventive care include? A yearly physical exam. This is also called an annual well check. Dental exams once or twice a year. Routine eye exams. Ask your health care provider how often you should have your eyes checked. Personal lifestyle choices, including: Daily care of your teeth and gums. Regular physical activity. Eating a healthy diet. Avoiding tobacco and drug use. Limiting alcohol use. Practicing safe sex. Taking low-dose aspirin daily starting at  age 90. Taking vitamin and mineral supplements as recommended by your health care provider. What happens during an annual well check? The services and screenings done by your health care provider during your annual well check will depend on your age, overall health, lifestyle risk factors, and family history of disease. Counseling  Your health care provider may ask you questions about your: Alcohol use. Tobacco use. Drug use. Emotional well-being. Home and relationship well-being. Sexual activity. Eating habits. Work and work Astronomer. Method of birth control. Menstrual cycle. Pregnancy history. Screening  You may have the following tests or measurements: Height, weight, and BMI. Blood pressure. Lipid and cholesterol levels. These may be checked every 5 years, or more frequently if you are over 79 years old. Skin check. Lung cancer screening. You may have this screening every year starting at age 7 if you have a 30-pack-year history of smoking and currently smoke or have quit within the past 15 years. Fecal occult blood test (FOBT) of the stool. You may have this test every year starting at age 43. Flexible sigmoidoscopy or colonoscopy. You may have a sigmoidoscopy every 5 years or a colonoscopy every 10 years starting at age 42. Hepatitis C blood test. Hepatitis B blood test. Sexually transmitted disease (STD) testing. Diabetes screening. This is done by checking your blood sugar (glucose) after you have not eaten for a while (  fasting). You may have this done every 1-3 years. Mammogram. This may be done every 1-2 years. Talk to your health care provider about when you should start having regular mammograms. This may depend on whether you have a family history of breast cancer. BRCA-related cancer screening. This may be done if you have a family history of breast, ovarian, tubal, or peritoneal cancers. Pelvic exam and Pap test. This may be done every 3 years starting at age 77.  Starting at age 18, this may be done every 5 years if you have a Pap test in combination with an HPV test. Bone density scan. This is done to screen for osteoporosis. You may have this scan if you are at high risk for osteoporosis. Discuss your test results, treatment options, and if necessary, the need for more tests with your health care provider. Vaccines  Your health care provider may recommend certain vaccines, such as: Influenza vaccine. This is recommended every year. Tetanus, diphtheria, and acellular pertussis (Tdap, Td) vaccine. You may need a Td booster every 10 years. Zoster vaccine. You may need this after age 37. Pneumococcal 13-valent conjugate (PCV13) vaccine. You may need this if you have certain conditions and were not previously vaccinated. Pneumococcal polysaccharide (PPSV23) vaccine. You may need one or two doses if you smoke cigarettes or if you have certain conditions. Talk to your health care provider about which screenings and vaccines you need and how often you need them. This information is not intended to replace advice given to you by your health care provider. Make sure you discuss any questions you have with your health care provider. Document Released: 09/24/2015 Document Revised: 05/17/2016 Document Reviewed: 06/29/2015 Elsevier Interactive Patient Education  2017 [employer].    Fall Prevention in the Home Falls can cause injuries. They can happen to people of all ages. There are many things you can do to make your home safe and to help prevent falls. What can I do on the outside of my home? Regularly fix the edges of walkways and driveways and fix any cracks. Remove anything that might make you trip as you walk through a door, such as a raised step or threshold. Trim any bushes or trees on the path to your home. Use bright outdoor lighting. Clear any walking paths of anything that might make someone trip, such as rocks or tools. Regularly check to see if  handrails are loose or broken. Make sure that both sides of any steps have handrails. Any raised decks and porches should have guardrails on the edges. Have any leaves, snow, or ice cleared regularly. Use sand or salt on walking paths during winter. Clean up any spills in your garage right away. This includes oil or grease spills. What can I do in the bathroom? Use night lights. Install grab bars by the toilet and in the tub and shower. Do not use towel bars as grab bars. Use non-skid mats or decals in the tub or shower. If you need to sit down in the shower, use a plastic, non-slip stool. Keep the floor dry. Clean up any water that spills on the floor as soon as it happens. Remove soap buildup in the tub or shower regularly. Attach bath mats securely with double-sided non-slip rug tape. Do not have throw rugs and other things on the floor that can make you trip. What can I do in the bedroom? Use night lights. Make sure that you have a light by your bed that is easy to  reach. Do not use any sheets or blankets that are too big for your bed. They should not hang down onto the floor. Have a firm chair that has side arms. You can use this for support while you get dressed. Do not have throw rugs and other things on the floor that can make you trip. What can I do in the kitchen? Clean up any spills right away. Avoid walking on wet floors. Keep items that you use a lot in easy-to-reach places. If you need to reach something above you, use a strong step stool that has a grab bar. Keep electrical cords out of the way. Do not use floor polish or wax that makes floors slippery. If you must use wax, use non-skid floor wax. Do not have throw rugs and other things on the floor that can make you trip. What can I do with my stairs? Do not leave any items on the stairs. Make sure that there are handrails on both sides of the stairs and use them. Fix handrails that are broken or loose. Make sure that  handrails are as long as the stairways. Check any carpeting to make sure that it is firmly attached to the stairs. Fix any carpet that is loose or worn. Avoid having throw rugs at the top or bottom of the stairs. If you do have throw rugs, attach them to the floor with carpet tape. Make sure that you have a light switch at the top of the stairs and the bottom of the stairs. If you do not have them, ask someone to add them for you. What else can I do to help prevent falls? Wear shoes that: Do not have high heels. Have rubber bottoms. Are comfortable and fit you well. Are closed at the toe. Do not wear sandals. If you use a stepladder: Make sure that it is fully opened. Do not climb a closed stepladder. Make sure that both sides of the stepladder are locked into place. Ask someone to hold it for you, if possible. Clearly mark and make sure that you can see: Any grab bars or handrails. First and last steps. Where the edge of each step is. Use tools that help you move around (mobility aids) if they are needed. These include: Canes. Walkers. Scooters. Crutches. Turn on the lights when you go into a dark area. Replace any light bulbs as soon as they burn out. Set up your furniture so you have a clear path. Avoid moving your furniture around. If any of your floors are uneven, fix them. If there are any pets around you, be aware of where they are. Review your medicines with your doctor. Some medicines can make you feel dizzy. This can increase your chance of falling. Ask your doctor what other things that you can do to help prevent falls. This information is not intended to replace advice given to you by your health care provider. Make sure you discuss any questions you have with your health care provider. Document Released: 06/24/2009 Document Revised: 02/03/2016 Document Reviewed: 10/02/2014 Elsevier Interactive Patient Education  2017 [employer].

## 2023-03-21 ENCOUNTER — Encounter (INDEPENDENT_AMBULATORY_CARE_PROVIDER_SITE_OTHER): Payer: Medicare Other | Admitting: Family Medicine

## 2023-03-27 ENCOUNTER — Ambulatory Visit (INDEPENDENT_AMBULATORY_CARE_PROVIDER_SITE_OTHER): Payer: Medicare Other | Admitting: Physician Assistant

## 2023-03-27 ENCOUNTER — Encounter (INDEPENDENT_AMBULATORY_CARE_PROVIDER_SITE_OTHER): Payer: Self-pay | Admitting: Physician Assistant

## 2023-03-27 VITALS — BP 139/73 | HR 75 | Temp 98.1°F | Ht 68.0 in | Wt 307.0 lb

## 2023-03-27 DIAGNOSIS — E1142 Type 2 diabetes mellitus with diabetic polyneuropathy: Secondary | ICD-10-CM | POA: Diagnosis not present

## 2023-03-27 DIAGNOSIS — D5919 Other autoimmune hemolytic anemia: Secondary | ICD-10-CM

## 2023-03-27 DIAGNOSIS — Z0289 Encounter for other administrative examinations: Secondary | ICD-10-CM

## 2023-03-27 DIAGNOSIS — I10 Essential (primary) hypertension: Secondary | ICD-10-CM | POA: Diagnosis not present

## 2023-03-27 DIAGNOSIS — Z6841 Body Mass Index (BMI) 40.0 and over, adult: Secondary | ICD-10-CM

## 2023-03-27 NOTE — Progress Notes (Signed)
Office: [phone]  /  Fax: [phone]   Initial Visit  Connie Mosley was seen in clinic today to evaluate for obesity. She is interested in losing weight to improve overall health and reduce the risk of weight related complications. She presents today to review program treatment options, initial physical assessment, and evaluation.     She was referred by: PCP  When asked what else they would like to accomplish? She states: Adopt healthier eating patterns, Improve energy levels and physical activity, Improve existing medical conditions, Reduce number of medications, Reduce risk for a surgery, Improve quality of life, Improve appearance, and Improve self-confidence  Weight history: Overweight all my life. Had left knee replacement and needs right knee replacement  When asked how has your weight affected you? She states: Has affected self-esteem, Relationships, Contributed to medical problems, Contributed to orthopedic problems or mobility issues, Having fatigue, and Having poor endurance  Some associated conditions: Hypertension, Arthritis:Knees and back, Diabetes, and Heart disease  Contributing factors: Nutritional, Medications, and Reduced physical activity  Weight promoting medications identified: None  Current nutrition plan: None  Current level of physical activity: Other: pool walking and stationary biking  Current or previous pharmacotherapy: None  Response to medication: Never tried medications   Past medical history includes:   Past Medical History:  Diagnosis Date   Hypertension 09/11/2008   Idiopathic autoimmune hemolytic anemia (HCC) 2010   rx included in past prednisone, retuximab, splenectomy   Irritable bowel syndrome 05/12/2012     Objective:   BP 139/73   Pulse 75   Temp 98.1 F (36.7 C)   Ht 5\' 8"  (1.727 m)   Wt (!) 307 lb (139.3 kg)   LMP 01/04/2010   SpO2 98%   BMI 46.68 kg/m  She was weighed on the bioimpedance scale: Body mass index is  46.68 kg/m.  Peak Weight:307 lbs , Body Fat%:52.7%, Visceral Fat Rating:20, Weight trend over the last 12 months: Unchanged  General:  Alert, oriented and cooperative. Patient is in no acute distress.  Respiratory: Normal respiratory effort, no problems with respiration noted   Gait: able to ambulate independently  Mental Status: Normal mood and affect. Normal behavior. Normal judgment and thought content.   DIAGNOSTIC DATA REVIEWED:  BMET    Component Value Date/Time   NA 139 02/13/2023 0833   K 4.3 02/13/2023 0833   CL 103 02/13/2023 0833   CO2 28 02/13/2023 0833   GLUCOSE 134 (H) 02/13/2023 0833   BUN 16 02/13/2023 0833   CREATININE 0.86 02/13/2023 0833   CREATININE 0.86 02/02/2023 0936   CALCIUM 9.3 02/13/2023 0833   GFRNONAA >60 02/02/2023 0936   Lab Results  Component Value Date   HGBA1C 6.5 02/13/2023   HGBA1C 6.1 01/05/2020   No results found for: "INSULIN" CBC    Component Value Date/Time   WBC 9.2 02/02/2023 0936   WBC 9.6 01/05/2020 1017   RBC 4.93 02/02/2023 0936   RBC 4.93 02/02/2023 0936   HGB 14.8 02/02/2023 0936   HCT 44.5 02/02/2023 0936   PLT 310 02/02/2023 0936   MCV 90.3 02/02/2023 0936   MCH 30.0 02/02/2023 0936   MCHC 33.3 02/02/2023 0936   RDW 15.0 02/02/2023 0936   Iron/TIBC/Ferritin/ %Sat No results found for: "IRON", "TIBC", "FERRITIN", "IRONPCTSAT" Lipid Panel     Component Value Date/Time   CHOL 193 02/13/2023 0833   TRIG 110.0 02/13/2023 0833   HDL 52.70 02/13/2023 0833   CHOLHDL 4 02/13/2023 0833   VLDL 22.0 02/13/2023  6213   LDLCALC 119 (H) 02/13/2023 0833   Hepatic Function Panel     Component Value Date/Time   PROT 6.8 02/02/2023 0936   ALBUMIN 4.2 02/02/2023 0936   AST 18 02/02/2023 0936   ALT 18 02/02/2023 0936   ALKPHOS 60 02/02/2023 0936   BILITOT 0.8 02/02/2023 0936      Component Value Date/Time   TSH 0.85 01/05/2020 1017     Assessment and Plan:   Type 2 diabetes mellitus with diabetic polyneuropathy,  without long-term current use of insulin (HCC)  Essential hypertension  Idiopathic autoimmune hemolytic anemia (HCC)  Morbid obesity with BMI of 45.0-49.9, adult (HCC)        Obesity Treatment / Action Plan:  Patient will work on garnering support from family and friends to begin weight loss journey. Will work on eliminating or reducing the presence of highly palatable, calorie dense foods in the home. Will complete provided nutritional and psychosocial assessment questionnaire before the next appointment. Will be scheduled for indirect calorimetry to determine resting energy expenditure in a fasting state.  This will allow [place] to create a reduced calorie, high-protein meal plan to promote loss of fat mass while preserving muscle mass. Will think about ideas on how to incorporate physical activity into their daily routine. Will avoid skipping meals which may result in increased hunger signals and overeating at certain times. Counseled on the health benefits of losing 5%-15% of total body weight. Was counseled on nutritional approaches to weight loss and benefits of reducing processed foods and consuming plant-based foods and high quality protein as part of nutritional weight management. Was counseled on pharmacotherapy and role as an adjunct in weight management.   Obesity Education Performed Today:  She was weighed on the bioimpedance scale and results were discussed and documented in the synopsis.  We discussed obesity as a disease and the importance of a more detailed evaluation of all the factors contributing to the disease.  We discussed the importance of long term lifestyle changes which include nutrition, exercise and behavioral modifications as well as the importance of customizing this to her specific health and social needs.  We discussed the benefits of reaching a healthier weight to alleviate the symptoms of existing conditions and reduce the risks of the biomechanical,  metabolic and psychological effects of obesity.  Connie Mosley appears to be in the action stage of change and states they are ready to start intensive lifestyle modifications and behavioral modifications.  30 minutes was spent today on this visit including the above counseling, pre-visit chart review, and post-visit documentation.  Reviewed by clinician on day of visit: allergies, medications, problem list, medical history, surgical history, family history, social history, and previous encounter notes pertinent to obesity diagnosis.   Seichi Kaufhold,PA-C

## 2023-03-29 ENCOUNTER — Other Ambulatory Visit: Payer: Self-pay | Admitting: Family Medicine

## 2023-03-29 DIAGNOSIS — Z1231 Encounter for screening mammogram for malignant neoplasm of breast: Secondary | ICD-10-CM

## 2023-03-29 LAB — HM DIABETES EYE EXAM

## 2023-04-24 ENCOUNTER — Encounter: Payer: Self-pay | Admitting: Internal Medicine

## 2023-04-24 ENCOUNTER — Ambulatory Visit: Payer: Medicare Other | Admitting: Internal Medicine

## 2023-04-24 VITALS — BP 134/86 | HR 85 | Temp 98.4°F | Ht 68.0 in | Wt 312.4 lb

## 2023-04-24 DIAGNOSIS — R3915 Urgency of urination: Secondary | ICD-10-CM | POA: Diagnosis not present

## 2023-04-24 LAB — POC URINALSYSI DIPSTICK (AUTOMATED)
Bilirubin, UA: NEGATIVE
Blood, UA: NEGATIVE
Glucose, UA: NEGATIVE
Ketones, UA: NEGATIVE
Leukocytes, UA: NEGATIVE
Nitrite, UA: NEGATIVE
Protein, UA: POSITIVE
Spec Grav, UA: 1.015 (ref 1.010–1.025)
Urobilinogen, UA: 0.2 E.U./dL
pH, UA: 7 (ref 5.0–8.0)

## 2023-04-24 MED ORDER — NITROFURANTOIN MONOHYD MACRO 100 MG PO CAPS
100.0000 mg | ORAL_CAPSULE | Freq: Two times a day (BID) | ORAL | 0 refills | Status: AC
Start: 2023-04-24 — End: 2023-04-29

## 2023-04-24 NOTE — Progress Notes (Signed)
[facility] [address] [place] [address] Dept: [phone] Dept Fax: [phone]  Acute Care Office Visit  Subjective:   Connie Mosley July 04, 1961 04/24/2023  Chief Complaint  Patient presents with   Urinary Tract Infection    Discomfort, urgency, low grade fever,    HPI: Discussed the use of AI scribe software for clinical note transcription with the patient, who gave verbal consent to proceed.  History of Present Illness   The patient, with a history of diabetes and a UTI in June 2024, presents with a suspected UTI. They report urinary frequency, urgency, and discomfort for the past eight days. They also report a low-grade fever 99.59F and a positive home UTI test. They deny URI symptoms, back pain, nausea, vomiting, and vaginal discharge. They have not taken any medication for the symptoms. Their diabetes is well-controlled, with a recent A1C of 6.5.      The following portions of the patient's history were reviewed and updated as appropriate: past medical history, past surgical history, family history, social history, allergies, medications, and problem list.   Patient Active Problem List   Diagnosis Date Noted   Type 2 diabetes mellitus with diabetic polyneuropathy, without long-term current use of insulin (HCC) 03/27/2023   Diabetic polyneuropathy (HCC) 02/13/2023   Hammer toes, bilateral 02/13/2023   Type 2 diabetes mellitus with neurological complications (HCC) 01/17/2022   PVCs (premature ventricular contractions) 01/17/2022   GERD (gastroesophageal reflux disease) 01/17/2022   Unilateral primary osteoarthritis, right knee 02/14/2021   Insulin resistance 01/05/2020   History of non anemic vitamin B12 deficiency 04/22/2018   Morbid obesity with BMI of 45.0-49.9, adult (HCC) 06/26/2017   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   Irritable bowel syndrome 05/12/2012   Essential hypertension 09/11/2008   Idiopathic  autoimmune hemolytic anemia (HCC) 2010   Past Medical History:  Diagnosis Date   Hypertension 09/11/2008   Idiopathic autoimmune hemolytic anemia (HCC) 2010   rx included in past prednisone, retuximab, splenectomy   Irritable bowel syndrome 05/12/2012   Past Surgical History:  Procedure Laterality Date   APPENDECTOMY  1982   SPLENECTOMY  05/2012   TONSILECTOMY/ADENOIDECTOMY WITH MYRINGOTOMY     childhood    TOTAL KNEE ARTHROPLASTY Left 2012   Family History  Adopted: Yes   Outpatient Medications Prior to Visit  Medication Sig Dispense Refill   cyanocobalamin (VITAMIN B12) 1000 MCG/ML injection Inject 1 mL (1,000 mcg total) into the muscle every 14 (fourteen) days. 2 mL 11   diclofenac (VOLTAREN) 75 MG EC tablet Take 1 tablet (75 mg total) by mouth 2 (two) times daily. 60 tablet 1   folic acid (FOLVITE) 1 MG tablet Take 1 tablet (1 mg total) by mouth daily. 90 tablet 5   lisinopril-hydrochlorothiazide (ZESTORETIC) 20-25 MG tablet TAKE 1 TABLET BY MOUTH EVERY DAY 90 tablet 3   metFORMIN (GLUCOPHAGE) 500 MG tablet TAKE 1 TABLET BY MOUTH EVERY DAY WITH BREAKFAST 90 tablet 3   metoprolol tartrate (LOPRESSOR) 25 MG tablet TAKE 1 TABLET (25 MG TOTAL) BY MOUTH 2 (TWO) TIMES DAILY. MAY TAKE ADDITIONAL 25 MG(1TABLET) AS NEEDED PRIOR TO EXERCISE (WITHIN 1 HOUR) 60 tablet 1   famotidine (PEPCID) 40 MG tablet TAKE 1 TABLET BY MOUTH EVERY DAY 90 tablet 3   nitrofurantoin, macrocrystal-monohydrate, (MACROBID) 100 MG capsule Take 1 capsule (100 mg total) by mouth 2 (two) times daily. 14 capsule 0   No facility-administered medications prior to visit.   Allergies  Allergen Reactions   Cefuroxime  Axetil Itching   Sulfamethoxazole-Trimethoprim Other (See Comments)    Reacts with Bone Marrow per Hemotologist    Cefuroxime Hives and Rash     ROS: A complete ROS was performed with pertinent positives/negatives noted in the HPI. The remainder of the ROS are negative.    Objective:   Today's  Vitals   04/24/23 0946  BP: 134/86  Pulse: 85  Temp: 98.4 F (36.9 C)  TempSrc: Temporal  SpO2: 96%  Weight: (!) 312 lb 6.4 oz (141.7 kg)  Height: 5\' 8"  (1.727 m)    GENERAL: Well-appearing, in NAD. Well nourished.  SKIN: Pink, warm and dry. No rash. NECK: Trachea midline. Full ROM w/o pain or tenderness. No lymphadenopathy.  RESPIRATORY: Chest wall symmetrical. Respirations even and non-labored. Breath sounds clear to auscultation bilaterally.  CARDIAC: S1, S2 present, regular rate and rhythm. Peripheral pulses 2+ bilaterally.  GI: Abdomen soft, discomfort to suprapubic region.  Normoactive bowel sounds. No CVA tenderness.  EXTREMITIES: Without clubbing, cyanosis, or edema.  PSYCH/MENTAL STATUS: Alert, oriented x 3. Cooperative, appropriate mood and affect.    Results for orders placed or performed in visit on 04/24/23  POCT Urinalysis Dipstick (Automated)  Result Value Ref Range   Color, UA yellow    Clarity, UA clear    Glucose, UA Negative Negative   Bilirubin, UA neg    Ketones, UA neg    Spec Grav, UA 1.015 1.010 - 1.025   Blood, UA neg    pH, UA 7.0 5.0 - 8.0   Protein, UA Positive Negative   Urobilinogen, UA 0.2 0.2 or 1.0 E.U./dL   Nitrite, UA neg    Leukocytes, UA Negative Negative      Assessment & Plan:  1. Urinary urgency - POCT Urinalysis Dipstick (Automated) - nitrofurantoin, macrocrystal-monohydrate, (MACROBID) 100 MG capsule; Take 1 capsule (100 mg total) by mouth 2 (two) times daily for 5 days.  Dispense: 10 capsule; Refill: 0 - Urine Culture  Meds ordered this encounter  Medications   nitrofurantoin, macrocrystal-monohydrate, (MACROBID) 100 MG capsule    Sig: Take 1 capsule (100 mg total) by mouth 2 (two) times daily for 5 days.    Dispense:  10 capsule    Refill:  0    Order Specific Question:   Supervising Provider    Answer:   Garnette Gunner [[id number]]     Meds ordered this encounter  Medications   nitrofurantoin,  macrocrystal-monohydrate, (MACROBID) 100 MG capsule    Sig: Take 1 capsule (100 mg total) by mouth 2 (two) times daily for 5 days.    Dispense:  10 capsule    Refill:  0    Order Specific Question:   Supervising Provider    Answer:   Garnette Gunner [[id number]]   Lab Orders         Urine Culture         POCT Urinalysis Dipstick (Automated)     No images are attached to the encounter or orders placed in the encounter.  Return if symptoms worsen or fail to improve.   Of note, portions of this note may have been created with voice recognition software [employer]ic[employer], medical). While this note has been edited for accuracy, occasional wrong-word or 'sound-a-like' substitutions may have occurred due to the inherent limitations of voice recognition software.  Salvatore Decent, FNP

## 2023-04-26 ENCOUNTER — Encounter (INDEPENDENT_AMBULATORY_CARE_PROVIDER_SITE_OTHER): Payer: Self-pay

## 2023-05-01 ENCOUNTER — Telehealth: Payer: Self-pay | Admitting: Hematology and Oncology

## 2023-05-01 ENCOUNTER — Ambulatory Visit (INDEPENDENT_AMBULATORY_CARE_PROVIDER_SITE_OTHER): Payer: Medicare Other | Admitting: Family Medicine

## 2023-05-02 ENCOUNTER — Telehealth: Payer: Self-pay | Admitting: Hematology and Oncology

## 2023-05-03 ENCOUNTER — Other Ambulatory Visit: Payer: Self-pay | Admitting: Hematology and Oncology

## 2023-05-03 DIAGNOSIS — D5919 Other autoimmune hemolytic anemia: Secondary | ICD-10-CM

## 2023-05-04 ENCOUNTER — Other Ambulatory Visit: Payer: Medicare Other

## 2023-05-04 ENCOUNTER — Inpatient Hospital Stay: Payer: Medicare Other | Attending: Hematology and Oncology

## 2023-05-04 DIAGNOSIS — D5919 Other autoimmune hemolytic anemia: Secondary | ICD-10-CM

## 2023-05-04 DIAGNOSIS — D591 Autoimmune hemolytic anemia, unspecified: Secondary | ICD-10-CM | POA: Insufficient documentation

## 2023-05-04 LAB — CBC WITH DIFFERENTIAL (CANCER CENTER ONLY)
Abs Immature Granulocytes: 0.03 10*3/uL (ref 0.00–0.07)
Basophils Absolute: 0.2 10*3/uL — ABNORMAL HIGH (ref 0.0–0.1)
Basophils Relative: 2 %
Eosinophils Absolute: 0.3 10*3/uL (ref 0.0–0.5)
Eosinophils Relative: 2 %
HCT: 42.1 % (ref 36.0–46.0)
Hemoglobin: 14.2 g/dL (ref 12.0–15.0)
Immature Granulocytes: 0 %
Lymphocytes Relative: 54 %
Lymphs Abs: 5.8 10*3/uL — ABNORMAL HIGH (ref 0.7–4.0)
MCH: 29.9 pg (ref 26.0–34.0)
MCHC: 33.7 g/dL (ref 30.0–36.0)
MCV: 88.6 fL (ref 80.0–100.0)
Monocytes Absolute: 1 10*3/uL (ref 0.1–1.0)
Monocytes Relative: 9 %
Neutro Abs: 3.6 10*3/uL (ref 1.7–7.7)
Neutrophils Relative %: 33 %
Platelet Count: 320 10*3/uL (ref 150–400)
RBC: 4.75 MIL/uL (ref 3.87–5.11)
RDW: 14.7 % (ref 11.5–15.5)
WBC Count: 10.9 10*3/uL — ABNORMAL HIGH (ref 4.0–10.5)
nRBC: 0 % (ref 0.0–0.2)

## 2023-05-04 LAB — CMP (CANCER CENTER ONLY)
ALT: 21 U/L (ref 0–44)
AST: 22 U/L (ref 15–41)
Albumin: 4.1 g/dL (ref 3.5–5.0)
Alkaline Phosphatase: 58 U/L (ref 38–126)
Anion gap: 5 (ref 5–15)
BUN: 13 mg/dL (ref 8–23)
CO2: 28 mmol/L (ref 22–32)
Calcium: 9.6 mg/dL (ref 8.9–10.3)
Chloride: 104 mmol/L (ref 98–111)
Creatinine: 0.78 mg/dL (ref 0.44–1.00)
GFR, Estimated: >60 mL/min (ref >60)
Glucose, Bld: 112 mg/dL — ABNORMAL HIGH (ref 70–99)
Potassium: 4 mmol/L (ref 3.5–5.1)
Sodium: 137 mmol/L (ref 135–145)
Total Bilirubin: 0.7 mg/dL (ref 0.3–1.2)
Total Protein: 6.9 g/dL (ref 6.5–8.1)

## 2023-05-04 LAB — RETIC PANEL
Immature Retic Fract: 6.1 % (ref 2.3–15.9)
RBC.: 4.76 MIL/uL (ref 3.87–5.11)
Retic Count, Absolute: 71.4 10*3/uL (ref 19.0–186.0)
Retic Ct Pct: 1.5 % (ref 0.4–3.1)
Reticulocyte Hemoglobin: 32.8 pg (ref >27.9)

## 2023-05-04 LAB — LACTATE DEHYDROGENASE: LDH: 158 U/L (ref 98–192)

## 2023-05-04 LAB — FOLATE: Folate: >40 ng/mL (ref >5.9)

## 2023-05-04 LAB — VITAMIN B12: Vitamin B-12: 399 pg/mL (ref 180–914)

## 2023-05-05 ENCOUNTER — Encounter: Payer: Self-pay | Admitting: Hematology and Oncology

## 2023-05-09 ENCOUNTER — Encounter (INDEPENDENT_AMBULATORY_CARE_PROVIDER_SITE_OTHER): Payer: Self-pay | Admitting: Family Medicine

## 2023-05-09 ENCOUNTER — Ambulatory Visit (INDEPENDENT_AMBULATORY_CARE_PROVIDER_SITE_OTHER): Payer: Medicare Other | Admitting: Family Medicine

## 2023-05-09 VITALS — BP 136/87 | HR 64 | Temp 98.4°F | Ht 68.0 in | Wt 302.0 lb

## 2023-05-09 DIAGNOSIS — Z6841 Body Mass Index (BMI) 40.0 and over, adult: Secondary | ICD-10-CM

## 2023-05-09 DIAGNOSIS — E538 Deficiency of other specified B group vitamins: Secondary | ICD-10-CM

## 2023-05-09 DIAGNOSIS — R0602 Shortness of breath: Secondary | ICD-10-CM | POA: Diagnosis not present

## 2023-05-09 DIAGNOSIS — D599 Acquired hemolytic anemia, unspecified: Secondary | ICD-10-CM

## 2023-05-09 DIAGNOSIS — E1169 Type 2 diabetes mellitus with other specified complication: Secondary | ICD-10-CM

## 2023-05-09 DIAGNOSIS — Z7984 Long term (current) use of oral hypoglycemic drugs: Secondary | ICD-10-CM

## 2023-05-09 DIAGNOSIS — E1159 Type 2 diabetes mellitus with other circulatory complications: Secondary | ICD-10-CM

## 2023-05-09 DIAGNOSIS — Z1331 Encounter for screening for depression: Secondary | ICD-10-CM

## 2023-05-09 DIAGNOSIS — R5383 Other fatigue: Secondary | ICD-10-CM

## 2023-05-09 DIAGNOSIS — I152 Hypertension secondary to endocrine disorders: Secondary | ICD-10-CM | POA: Diagnosis not present

## 2023-05-09 NOTE — Progress Notes (Signed)
Carlye Grippe, D.O.  ABFM, ABOM Specializing in Clinical Bariatric Medicine Office located at: [address]  [place], [place]  [address]     Bariatric Medicine Visit  Dear Veto Kemps, Bertram Millard, MD   Thank you for referring Connie Mosley to our clinic today for evaluation.  We performed a consultation to discuss her options for treatment and educate the patient on her disease state.  The following note includes my evaluation and treatment recommendations.   Please do not hesitate to reach out to me directly if you have any further concerns.    Assessment and Plan:  Review labs done by me and PCP with pt next OV.  Orders Placed This Encounter  Procedures   VITAMIN D 25 Hydroxy (Vit-D Deficiency, Fractures)   Hemoglobin A1c   Insulin, random   T4, free   TSH   EKG 12-Lead    Medications Discontinued During This Encounter  Medication Reason   metoprolol tartrate (LOPRESSOR) 25 MG tablet      Fatigue Assessment:  Connie Mosley does feel that her weight is causing her energy to be lower than it should be. Fatigue may be related to obesity, depression or many other causes. she does not appear to have any red flag symptoms and this appears to most likely be related to her current lifestyle habits and dietary intake.  Plan:  Labs will be ordered and reviewed with her at their next office visit in two weeks.  Epworth sleepiness scale score appears to be within normal limits.  Her ESS score is 4.   Connie denies daytime somnolence and denies waking up still tired. Patient has a history of symptoms of hypertension. Akili generally gets 9 or 10 hours of sleep per night, and states that she has generally restful sleep. Snoring is not present. Apneic episodes is not present.   ECG: Performed and reviewed/ interpreted independently.  Normal sinus rhythm, rate 70bpm; with occasional PVC's no change from prior and no acute findings, will continue to monitor for symptoms. Patient has a  cardiologist and the PVC's have been chronic yet stable and was told to decrease her caffeine intake which we also encouraged.   Modified PHQ-9 Depression Screen: Her Food and Mood (modified PHQ-9) score was 10.  In the meanwhile, Connie Mosley will focus on self care including making healthy food choices by following their meal plan, improving sleep quality and focusing on stress reduction.  Once we are assured she is on an appropriate meal plan, we will start discussing exercise to increase cardiovascular fitness levels.    Shortness of breath on exertion Assessment:  Connie Mosley does feel that she gets out of breath more easily than she used to when she exercises and seems to be worsening over time with weight gain.  This has gotten worse recently. Dally denies shortness of breath at rest or orthopnea. Connie Mosley's shortness of breath appears to be obesity related and exercise induced, as they do not appear to have any "red flag" symptoms/ concerns today.  Also, this condition appears to be related to a state of poor cardiovascular conditioning   Plan:  Obtain labs today and will be reviewed with her at their next office visit in two weeks.  Indirect Calorimeter completed today to help guide our dietary regimen. It shows a VO2 of 306 and a REE of 2117.  Her calculated basal metabolic rate is 6213 thus her measured basal metabolic rate is worse than expected.  Patient agreed to work on weight loss  at this time.  As Connie Mosley progresses through our weight loss program, we will gradually increase exercise as tolerated to treat her current condition.   If Connie Mosley follows our recommendations and loses 5-10% of their weight without improvement of her shortness of breath or if at any time, symptoms become more concerning, they agree to urgently follow up with their PCP/ specialist for further consideration/ evaluation.   Connie Mosley verbalizes agreement with this plan.    Hypertension associated with diabetes (HCC) Assessment:  Condition is Controlled. She monitors her BP at home and this ranges within 118/76-82. This is well controlled with Lisinopril-hydrochlorothiazide daily.  She tolerates this well and denies any adverse side effects with this medication.  Last 3 blood pressure readings in our office are as follows: BP Readings from Last 3 Encounters:  05/09/23 136/87  04/24/23 134/86  03/27/23 139/73    Plan: - Continue Lisinopril-hydrochlorothiazide 20-25mg  daily as directed by her PCP.   - Connie Mosley BP is 136/87 at goal today.   - Avoid buying foods that are: processed, frozen, or prepackaged to avoid excess salt.  - Ambulatory blood pressure monitoring encouraged.  - We will continue to monitor closely alongside PCP/ specialists as they relate to the her weight loss journey.   Type 2 diabetes mellitus with morbid obesity (HCC) Assessment: Condition is Not at goal.. Her A1c level is elevated at 6.5 and her insulin levels have not been taken in over 3+ months. Pt informed me that in the past years she was placed on prednisone due to her hemolytic anemia which caused an increase in her A1c and insulin levels so she was placed on an injectable insulin which she no longer takes and was switched to Metformin. She continues Metformin daily and has been on this for about 44yrs. She denies any GI upset or adverse side effects.   Lab Results  Component Value Date   HGBA1C 6.5 02/13/2023   HGBA1C 6.8 (H) 08/09/2022   HGBA1C 6.7 (H) 04/04/2022    Plan:- Continue Metformin 500mg  daily with breakfast as directed by her PCP.    - I explained role of simple carbs and insulin levels on hunger and cravings.   - Hypoglycemia prevention discussed with the patient.  Eat on a regular basis- no skipping or going long periods without eating.      - Recommend that any concerns about medicines should be directed at the prescribing provider  - Recheck labs in 3 months if not done at Endo provider / PCP.    Acquired  hemolytic anemia (HCC) Assessment: This is monitored by her hematologist and was advised to take an folic acid supplement to help improve this condition. She currently takes an folic acid 1mg  supplement daily.  Lab Results  Component Value Date   WBC 10.9 (H) 05/04/2023   HGB 14.2 05/04/2023   HCT 42.1 05/04/2023   MCV 88.6 05/04/2023   PLT 320 05/04/2023       Component Ref Range & Units 5 d ago 3 mo ago  Folate >5.9 ng/mL >40.0 >40.0 CM     Plan: - Continue with the folic acid supplement daily as directed by her hematologist.    B12 deficiency Assessment: Condition is Not at goal. Pt B-12 level was extremely high 3 months ago, she was denied a retest and was told to wait 3 months. Her B-12 level dropped to 399 as of 05/04/2023. She currently takes B-12 injections once every 2 weeks.  Component Ref Range & Units 5 d ago 3 mo ago  Vitamin B-12 180 - 914 pg/mL 399 >7,500 High  CM     Plan: - Continue B-12 injection as directed.   - Continue to increase her food intake with B-12 rich foods such as dark leafy greens, lean red meat, fish, etc.   - Begin her prudent nutritional plan that is low in simple carbohydrates, saturated fats and trans fats to goal of 5-10% weight loss to achieve significant health benefits.  Pt encouraged to continually advance exercise and cardiovascular fitness as tolerated throughout weight loss journey.   TREATMENT PLAN FOR OBESITY: BMI 45.0-49.9, adult (HCC) Morbid obesity with BMI of 45.0-49.9, adult (HCC) -starting BMI 45.93 Assessment: Muscle mass is  147.2lb. Fat mass is 147.6lb.Total body water is  105lb.   Plan:  Connie Mosley will work on healthier eating habits and try their best to follow the Category 3 meal plan with 100 snack calories best they can.   - I recommended Fairlife skim milk as this is gluten free.   - I also recommended that Connie Mosley can try Skinny Girl Bbq sauce.   Behavioral Intervention Additional resources  provided today: category 3 meal plan information Evidence-based interventions for health behavior change were utilized today including the discussion of self monitoring techniques, problem-solving barriers and SMART goal setting techniques.   Regarding patient's less desirable eating habits and patterns, we employed the technique of small changes.  Pt will specifically work on: Beginning the nutritional meal plan for next visit.    FOLLOW UP: Follow up in 2 weeks. She was informed of the importance of frequent follow up visits to maximize her success with intensive lifestyle modifications for her multiple health conditions.  Connie Mosley is aware that we will review all of her lab results at our next visit.  She is aware that if anything is critical/ life threatening with the results, we will be contacting her via MyChart prior to the office visit to discuss management.    Chief Complaint:   OBESITY Connie Mosley (MR# [id number]) is a 62 y.o. female who presents for evaluation and treatment of obesity and related comorbidities. Current BMI is Body mass index is 45.92 kg/m. Nalaya has been struggling with her weight for many years and has been unsuccessful in either losing weight, maintaining weight loss, or reaching her healthy weight goal.  Connie Mosley is currently in the action stage of change and ready to dedicate time achieving and maintaining a healthier weight. Connie Mosley is interested in becoming our patient and working on intensive lifestyle modifications including (but not limited to) diet and exercise for weight loss.  Connie Mosley works is retired. Patient is married to Connie Mosley  and does not have children. She lives with her husband only.  Connie Mosley desires to lose weight to decrease the number of medications she takes, decrease her joint and muscle pain, have more energy, and enjoy life. She has not tried any diets or weight loss plans although she was told in the past to limit her carb  intake. Pt states that her heaviest weight was 307. She does some form of exercise such as walking in the pool and using her recumbent bike for 20 mins 3 days a week. Connie Mosley rarely eats outside the home and does most of the grocery shopping herself. She does like to cook and has no obstacles hindering her from that. She tends to have cravings mainly in the afternoon and usually craves bread and chocolate.  Connie Mosley endorses not snacking frequently and when snacking she usually snacks on gluten free snacks. She skips lunch daily and drinks soda twice per week. She drinks coffee with creamer, juice, and a alcoholic cocktail once a week. She feels as her worst food habit is not knowing which foods to eat and portion sizes to lose weight. She denies excessive hunger but struggles with over consumption of food. She tends to eat when she is bored or as an award. She endorses feeling guilt when making poor food choices as "she should know better". She feels out of control when eating and gets uncomfortably full. Pt endorses that gaining weight makes it harder to enjoy life, effects her mood/fatigue, and has effected her mobility.    Subjective:   This is the patient's first visit at [facility].  The patient's NEW PATIENT PACKET that they filled out prior to today's office visit was reviewed at length and information from that paperwork was included within the following office visit note.    Included in the packet: current and past health history, medications, allergies, ROS, gynecologic history (women only), surgical history, family history, social history, weight history, weight loss surgery history (for those that have had weight loss surgery), nutritional evaluation, mood and food questionnaire along with a depression screening (PHQ9) on all patients, an Epworth questionnaire, sleep habits questionnaire, patient life and health improvement goals questionnaire. These will all be scanned into the  patient's chart under the "media" tab.   Review of Systems: Please refer to new patient packet scanned into media. Pertinent positives were addressed with patient today.  Reviewed by clinician on day of visit: allergies, medications, problem list, medical history, surgical history, family history, social history, and previous encounter notes.  During the visit, I independently reviewed the patient's EKG, bioimpedance scale results, and indirect calorimeter results. I used this information to tailor a meal plan for the patient that will help Connie Mosley to lose weight and will improve her obesity-related conditions going forward.  I performed a medically necessary appropriate examination and/or evaluation. I discussed the assessment and treatment plan with the patient. The patient was provided an opportunity to ask questions and all were answered. The patient agreed with the plan and demonstrated an understanding of the instructions. Labs were ordered today (unless patient declined them) and will be reviewed with the patient at our next visit unless more critical results need to be addressed immediately. Clinical information was updated and documented in the EMR.   Objective:   PHYSICAL EXAM: Blood pressure 136/87, pulse 64, temperature 98.4 F (36.9 C), height 5\' 8"  (1.727 m), weight (!) 302 lb (137 kg), last menstrual period 01/04/2010, SpO2 98%. Body mass index is 45.92 kg/m. General: Well Developed, well nourished, and in no acute distress.  HEENT: Normocephalic, atraumatic Skin: Warm and dry, cap RF less 2 sec, good turgor Chest:  Normal excursion, shape, no gross abn Respiratory: speaking in full sentences, no conversational dyspnea NeuroM-Sk: Ambulates w/o assistance, moves * 4 Psych: A and O *3, insight good, mood-full  Anthropometric Measurements Height: 5\' 8"  (1.727 m) Weight: (!) 302 lb (137 kg) BMI (Calculated): 45.93 Weight at Last Visit: na Weight Lost Since Last Visit:  na Weight Gained Since Last Visit: na Starting Weight: 302lb Total Weight Loss (lbs): 0 lb (0 kg) Peak Weight: 307lb Waist Measurement : 57 inches   Body Composition  Body Fat %: 48.8 % Fat Mass (lbs): 147.6 lbs Muscle Mass (lbs): 147.2 lbs Total Body  Water (lbs): 105 lbs Visceral Fat Rating : 18   Other Clinical Data RMR: 2117 Fasting: yes Labs: yes Today's Visit #: 1 Starting Date: 05/09/23 Comments: first visit    DIAGNOSTIC DATA REVIEWED:  BMET    Component Value Date/Time   NA 137 05/04/2023 1150   K 4.0 05/04/2023 1150   CL 104 05/04/2023 1150   CO2 28 05/04/2023 1150   GLUCOSE 112 (H) 05/04/2023 1150   BUN 13 05/04/2023 1150   CREATININE 0.78 05/04/2023 1150   CALCIUM 9.6 05/04/2023 1150   GFRNONAA >60 05/04/2023 1150   Lab Results  Component Value Date   HGBA1C 6.5 02/13/2023   HGBA1C 6.1 01/05/2020   No results found for: "INSULIN" Lab Results  Component Value Date   TSH 0.85 01/05/2020   CBC    Component Value Date/Time   WBC 10.9 (H) 05/04/2023 1150   WBC 9.6 01/05/2020 1017   RBC 4.76 05/04/2023 1150   RBC 4.75 05/04/2023 1150   HGB 14.2 05/04/2023 1150   HCT 42.1 05/04/2023 1150   PLT 320 05/04/2023 1150   MCV 88.6 05/04/2023 1150   MCH 29.9 05/04/2023 1150   MCHC 33.7 05/04/2023 1150   RDW 14.7 05/04/2023 1150   Iron Studies No results found for: "IRON", "TIBC", "FERRITIN", "IRONPCTSAT" Lipid Panel     Component Value Date/Time   CHOL 193 02/13/2023 0833   TRIG 110.0 02/13/2023 0833   HDL 52.70 02/13/2023 0833   CHOLHDL 4 02/13/2023 0833   VLDL 22.0 02/13/2023 0833   LDLCALC 119 (H) 02/13/2023 0833   Hepatic Function Panel     Component Value Date/Time   PROT 6.9 05/04/2023 1150   ALBUMIN 4.1 05/04/2023 1150   AST 22 05/04/2023 1150   ALT 21 05/04/2023 1150   ALKPHOS 58 05/04/2023 1150   BILITOT 0.7 05/04/2023 1150      Component Value Date/Time   TSH 0.85 01/05/2020 1017   Nutritional No results found for:  "VD25OH"  Attestation Statements:   I, Clinical biochemist, acting as a Stage manager for [employer], DO., have compiled all relevant documentation for today's office visit on behalf of Thomasene Lot, DO, while in the presence of [employer], DO.  Time spent on visit including pre-visit chart review and post-visit care was estimated to be 60 minutes. Over 50% of the time was spent in direct face to face counseling and coordination of care.  I have reviewed the above documentation for accuracy and completeness, and I agree with the above. Carlye Grippe, D.O.  The 21st Century Cures Act was signed into law in 2016 which includes the topic of electronic health records.  This provides immediate access to information in MyChart.  This includes consultation notes, operative notes, office notes, lab results and pathology reports.  If you have any questions about what you read please let us know at your next visit so we can discuss your concerns and take corrective action if need be.  We are right here with you.

## 2023-05-10 LAB — VITAMIN D 25 HYDROXY (VIT D DEFICIENCY, FRACTURES): Vit D, 25-Hydroxy: 29.6 ng/mL — ABNORMAL LOW (ref 30.0–100.0)

## 2023-05-10 LAB — HEMOGLOBIN A1C
Est. average glucose Bld gHb Est-mCnc: 137 mg/dL
Hgb A1c MFr Bld: 6.4 % — ABNORMAL HIGH (ref 4.8–5.6)

## 2023-05-10 LAB — TSH: TSH: 1.12 u[IU]/mL (ref 0.450–4.500)

## 2023-05-10 LAB — T4, FREE: Free T4: 1.41 ng/dL (ref 0.82–1.77)

## 2023-05-10 LAB — METHYLMALONIC ACID, SERUM: Methylmalonic Acid, Quantitative: 243 nmol/L (ref 0–378)

## 2023-05-10 LAB — INSULIN, RANDOM: INSULIN: 40.3 u[IU]/mL — ABNORMAL HIGH (ref 2.6–24.9)

## 2023-05-15 ENCOUNTER — Ambulatory Visit
Admission: RE | Admit: 2023-05-15 | Discharge: 2023-05-15 | Disposition: A | Discharge status: Home / Self Care | Payer: Medicare Other | Source: Ambulatory Visit | Attending: Family Medicine | Admitting: Family Medicine

## 2023-05-15 DIAGNOSIS — Z1231 Encounter for screening mammogram for malignant neoplasm of breast: Secondary | ICD-10-CM

## 2023-05-16 ENCOUNTER — Encounter: Payer: Self-pay | Admitting: Family Medicine

## 2023-05-16 ENCOUNTER — Ambulatory Visit: Payer: Medicare Other | Admitting: Family Medicine

## 2023-05-16 VITALS — BP 122/70 | HR 80 | Temp 98.6°F | Ht 68.0 in | Wt 304.6 lb

## 2023-05-16 DIAGNOSIS — N3000 Acute cystitis without hematuria: Secondary | ICD-10-CM

## 2023-05-16 DIAGNOSIS — Z23 Encounter for immunization: Secondary | ICD-10-CM | POA: Diagnosis not present

## 2023-05-16 DIAGNOSIS — E1142 Type 2 diabetes mellitus with diabetic polyneuropathy: Secondary | ICD-10-CM

## 2023-05-16 DIAGNOSIS — M5481 Occipital neuralgia: Secondary | ICD-10-CM

## 2023-05-16 DIAGNOSIS — I10 Essential (primary) hypertension: Secondary | ICD-10-CM | POA: Diagnosis not present

## 2023-05-16 DIAGNOSIS — Z7984 Long term (current) use of oral hypoglycemic drugs: Secondary | ICD-10-CM

## 2023-05-16 DIAGNOSIS — M1711 Unilateral primary osteoarthritis, right knee: Secondary | ICD-10-CM | POA: Diagnosis not present

## 2023-05-16 DIAGNOSIS — Z6841 Body Mass Index (BMI) 40.0 and over, adult: Secondary | ICD-10-CM

## 2023-05-16 LAB — POCT URINALYSIS DIPSTICK
Bilirubin, UA: NEGATIVE
Blood, UA: NEGATIVE
Glucose, UA: NEGATIVE
Ketones, UA: NEGATIVE
Nitrite, UA: NEGATIVE
Protein, UA: POSITIVE — AB
Spec Grav, UA: 1.02 (ref 1.010–1.025)
Urobilinogen, UA: 0.2 U/dL
pH, UA: 6 (ref 5.0–8.0)

## 2023-05-16 MED ORDER — NAPROXEN 500 MG PO TABS
500.0000 mg | ORAL_TABLET | Freq: Two times a day (BID) | ORAL | 0 refills | Status: DC
Start: 2023-05-16 — End: 2023-11-20

## 2023-05-16 MED ORDER — DICLOFENAC SODIUM 75 MG PO TBEC
75.0000 mg | DELAYED_RELEASE_TABLET | Freq: Two times a day (BID) | ORAL | 1 refills | Status: DC
Start: 2023-05-16 — End: 2023-10-01

## 2023-05-16 MED ORDER — CIPROFLOXACIN HCL 500 MG PO TABS
500.0000 mg | ORAL_TABLET | Freq: Two times a day (BID) | ORAL | 0 refills | Status: AC
Start: 2023-05-16 — End: 2023-05-23

## 2023-05-16 NOTE — Addendum Note (Signed)
Addended by: Mickle Plumb L on: 05/16/2023 11:50 AM   Modules accepted: Orders

## 2023-05-16 NOTE — Assessment & Plan Note (Signed)
Blood pressure is in good control. Continue lisinopril-HCTZ (Zestoretic) 20-25 mg daily.

## 2023-05-16 NOTE — Progress Notes (Signed)
[facility] [address] [facility] [address] [place] [address] Dept: [phone] Dept Fax: [phone]  Chronic Care Office Visit  Subjective:    Patient ID: Connie Mosley, female    DOB: 1961-04-24, 62 y.o..   MRN: [id number]  Chief Complaint  Patient presents with   Hypertension    3 month f/u.  C/o having HA x 3 days and possible UTI.     History of Present Illness:  Patient is in today for reassessment of chronic medical issues.  Ms. Parkman has essential hypertension. She is managed on Zestoretic 20-25 mg daily.    Ms. Kishel has a history of right knee osteoarthritis. Orthopedics has recommended she have a knee joint replacement, but wants her to lose 40 lbs before proceeding with surgery. Ms. Amsberry is now [facility].   Ms. Nettle has Type 2 diabetes. She is managed on metformin 500 mg daily. Recent blood testing confirms she has insulin resistance.  Ms. Deatrick was seen 3 weeks ago with symptoms of a UTI. She was treated with nitrofurantoin. Her culture did show an E. coli which should have been responsive tot his antibiotic. However, she notes for the past week, her symptoms have recurred.  Ms. Frymier notes for the past 2 days, she has had a pins and needles sensation over the occipital scalp ont he right. She has been taking 400 mg of ibuprofen every 4 hours without relief. She finds that even light touch brings on the pain, such as brushing her hair. She does not recall any specific inciting event.  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 2 mL 11   folic acid (FOLVITE) 1 MG tablet Take 1 tablet (1 mg total) by mouth daily. 90 tablet 5   lisinopril-hydrochlorothiazide (ZESTORETIC) 20-25 MG tablet TAKE 1 TABLET BY MOUTH EVERY DAY 90 tablet 3   metFORMIN (GLUCOPHAGE) 500 MG tablet TAKE 1 TABLET BY MOUTH EVERY DAY WITH BREAKFAST 90 tablet 3   diclofenac (VOLTAREN) 75 MG EC tablet Take 1 tablet (75 mg total) by mouth 2 (two) times daily. 60 tablet 1   No facility-administered medications prior to visit.   Allergies  Allergen Reactions   Cefuroxime Axetil Itching   Sulfamethoxazole-Trimethoprim Other (See Comments)    Reacts with Bone Marrow per Hemotologist    Cefuroxime Hives and Rash   Objective:  Today's Vitals   05/16/23 0756  BP: 122/70  Pulse: 80  Temp: 98.6 F (37 C)  TempSrc: Temporal  SpO2: 97%  Weight: (!) 304 lb 9.6 oz (138.2 kg)  Height: 5\' 8"  (1.727 m)   Body mass index is 46.31 kg/m.   General: Well developed, well nourished. No acute distress. HEENT: Normocephalic, non-traumatic. Scalp appears normal. There is pain over the occipital and posterior parietal   scalp on the right. Neck: Supple. No pain on  palpation over neck. Psych: Alert and oriented. Normal mood and affect.  Health Maintenance Due  Topic Date Due   OPHTHALMOLOGY EXAM  Never done   HIV Screening  Never done   Hepatitis C Screening  Never done   INFLUENZA VACCINE  04/12/2023   MAMMOGRAM  05/12/2023   Lab Results Last hemoglobin A1c Lab Results  Component Value Date   HGBA1C 6.4 (H) 05/09/2023      Component Ref Range & Units 08:20  Color, UA yellow  Clarity, UA cloudy  Glucose, UA Negative Negative  Bilirubin, UA neg  Ketones, UA neg  Spec Grav, UA 1.010 - 1.025 1.020  Blood, UA neg  pH, UA 5.0 - 8.0 6.0  Protein, UA Negative Positive Abnormal   Urobilinogen, UA 0.2 or 1.0 E.U./dL 0.2  Comment: 15 mg/dl  Nitrite, UA neg  Leukocytes, UA Negative Moderate (2+) Abnormal      Assessment & Plan:   Problem List Items Addressed This Visit       Cardiovascular and Mediastinum   Essential hypertension    Blood pressure is in good control. Continue lisinopril-HCTZ (Zestoretic) 20-25 mg daily.         Endocrine   Type 2 diabetes mellitus with diabetic polyneuropathy, without long-term current use of insulin (HCC)    A1c is at goal. Continue metformin 500 mg daily.        Musculoskeletal and Integument   Unilateral primary osteoarthritis, right knee    Stable. I will renew diclofenac for knee pain. Ultimately knee joint replacement is needed.      Relevant Medications   diclofenac (VOLTAREN) 75 MG EC tablet   naproxen (NAPROSYN) 500 MG tablet     Genitourinary   Acute cystitis without hematuria - Primary    UA consistent with a recurrent UTIl. I will send for culture. Plan to treat with a course of Cipro (failed nitrofurantoin, allergy to sulfa and PCNs). Repeat UA in 3 weeks.       Relevant Medications   ciprofloxacin (CIPRO) 500 MG tablet   Other Relevant Orders   POCT Urinalysis Dipstick (Completed)   Urine Culture   Urinalysis w microscopic + reflex cultur     Other   Morbid  obesity with BMI of 45.0-49.9, adult (HCC)    Has established with [facility]. Lab evaluation completed. She has an upcoming appointment to review results with [facility].      Other Visit Diagnoses     Occipital neuritis       I will try her on naproxen 500 mg bid for 7 days. Recommend heat. If not improved, would consider a course of prednisone.   Relevant Medications   naproxen (NAPROSYN) 500 MG tablet       Return in about 3 months (around 08/15/2023) for Reassessment.   Loyola Mast, MD

## 2023-05-16 NOTE — Assessment & Plan Note (Signed)
UA consistent with a recurrent UTIl. I will send for culture. Plan to treat with a course of Cipro (failed nitrofurantoin, allergy to sulfa and PCNs). Repeat UA in 3 weeks.

## 2023-05-16 NOTE — Addendum Note (Signed)
Addended by: Loyola Mast on: 05/16/2023 04:56 PM   Modules accepted: Level of Service

## 2023-05-16 NOTE — Assessment & Plan Note (Signed)
A1c is at goal. Continue metformin 500 mg daily.

## 2023-05-16 NOTE — Assessment & Plan Note (Signed)
Has established with [facility]. Lab evaluation completed. She has an upcoming appointment to review results with [facility].

## 2023-05-16 NOTE — Assessment & Plan Note (Signed)
Stable. I will renew diclofenac for knee pain. Ultimately knee joint replacement is needed.

## 2023-05-18 LAB — URINE CULTURE
MICRO NUMBER:: 15420205
SPECIMEN QUALITY:: ADEQUATE

## 2023-05-22 ENCOUNTER — Ambulatory Visit (INDEPENDENT_AMBULATORY_CARE_PROVIDER_SITE_OTHER): Payer: Medicare Other | Admitting: Family Medicine

## 2023-06-05 ENCOUNTER — Encounter (INDEPENDENT_AMBULATORY_CARE_PROVIDER_SITE_OTHER): Payer: Self-pay | Admitting: Family Medicine

## 2023-06-05 ENCOUNTER — Ambulatory Visit (INDEPENDENT_AMBULATORY_CARE_PROVIDER_SITE_OTHER): Payer: Medicare Other | Admitting: Family Medicine

## 2023-06-05 VITALS — BP 135/73 | HR 79 | Temp 98.4°F | Ht 68.0 in | Wt 296.0 lb

## 2023-06-05 DIAGNOSIS — Z6841 Body Mass Index (BMI) 40.0 and over, adult: Secondary | ICD-10-CM

## 2023-06-05 DIAGNOSIS — E1169 Type 2 diabetes mellitus with other specified complication: Secondary | ICD-10-CM

## 2023-06-05 DIAGNOSIS — Z7984 Long term (current) use of oral hypoglycemic drugs: Secondary | ICD-10-CM

## 2023-06-05 DIAGNOSIS — E559 Vitamin D deficiency, unspecified: Secondary | ICD-10-CM | POA: Diagnosis not present

## 2023-06-05 MED ORDER — VITAMIN D (ERGOCALCIFEROL) 1.25 MG (50000 UNIT) PO CAPS
50000.0000 [IU] | ORAL_CAPSULE | ORAL | 0 refills | Status: DC
Start: 2023-06-05 — End: 2023-07-11

## 2023-06-05 NOTE — Progress Notes (Signed)
Connie Mosley, D.O.  ABFM, ABOM Clinical Bariatric Medicine Physician  Office located at: [address] [place], [place]  [address]     Assessment and Plan:   Meds ordered this encounter  Medications   Vitamin D, Ergocalciferol, (DRISDOL) 1.25 MG (50000 UNIT) CAPS capsule    Sig: Take 1 capsule (50,000 Units total) by mouth every 7 (seven) days.    Dispense:  4 capsule    Refill:  0     Type 2 diabetes mellitus with morbid obesity (HCC) Assessment & Plan: Lab Results  Component Value Date   HGBA1C 6.4 (H) 05/09/2023   HGBA1C 6.5 02/13/2023   HGBA1C 6.8 (H) 08/09/2022   INSULIN 40.3 (H) 05/09/2023   Lab Results  Component Value Date   TSH 1.120 05/09/2023   FREET4 1.41 05/09/2023    T2DM treated with Metformin 500 mg daily, tolerating medication well - denies any GI upset. A1c decreased slightly from 6.5 to 6.4. Fasting insulin is elevated at 40.3 & roughly 8 times normal. No concerns with thyroid levels.   Explained role of simple carbs and insulin levels on hunger and cravings.  Educated patient that having protein with each meal is important for stabilizing sugars and an important part of her diabetes management as well as controlling hunger and cravings. Continue to decrease simple carbs/ sugars; increase fiber and proteins -> follow her meal plan. C/w Metformin per PCP.    Vitamin D deficiency - new onset Assessment & Plan: Lab Results  Component Value Date   VD25OH 29.6 (L) 05/09/2023   Vitamin D levels are sub-optimal at 29.6. Goal: 50 to 70-80. I discussed the importance of vitamin D to the patient's health and well-being as well as to their ability to lose weight. I reviewed possible symptoms of low Vitamin D:  low energy, depressed mood, muscle aches, joint aches, osteoporosis etc. with patient. Low Vitamin D levels may be linked to an increased risk of cardiovascular events and even increased risk of cancers- such as colon and breast. Pt instructed to  begin ERGO 50,000 units once a wk.    BMI 45.0-49.9, adult (HCC) - current BMI 45.02 Morbid obesity with BMI of 45.0-49.9, adult [facility]) Assessment & Plan: Since last office visit patient's muscle mass has decreased by 9.2 lb. Fat mass has increased by 3.2 lb. Total body water has decreased by 4 lb.  Counseling done on how various foods will affect these numbers and how to maximize success  Total lbs lost to date: 6 lbs  Total weight loss percentage to date: 1.99%   Meal plan: Category 3 meal plan with lunch options and only 100 snack calories  Recommended pt to use Chat GPT for low calorie/low carb/ high protein recipe ideas.   Behavioral Intervention Additional resources provided today: lunch options, healthy tuna salad recipe, mini pumpkin pies recipe, recipe packet #1.  Evidence-based interventions for health behavior change were utilized today including the discussion of self monitoring techniques, problem-solving barriers and SMART goal setting techniques.   Regarding patient's less desirable eating habits and patterns, we employed the technique of small changes.  Pt will specifically work on: increasing lean protein intake for next visit.   FOLLOW UP: Return in about 23 days (around 06/28/2023). She was informed of the importance of frequent follow up visits to maximize her success with intensive lifestyle modifications for her multiple health conditions.  Subjective:   Chief complaint: Obesity Connie is here to discuss her progress with her  obesity treatment plan. She is on the Category 3 Plan with 100 snack calories and states she is following her eating plan approximately 75% of the time. She states she is going to the pool 20 minutes 3 days per week.  Interval History:  Connie Mosley is here today for her first follow-up office visit since starting the program with [place].  Since last office visit she has been doing well. Her impression about the meal plan: "felt like a Mosley of food,  especially the protein portions". In general, she could not eat all the foods on her meal plan. Feels that hunger and cravings are well controlled.   All blood work/ lab tests that were recently ordered by myself or an outside provider were reviewed with patient today per their request. Extended time was spent counseling her on all new disease processes that were discovered or preexisting ones that are affected by BMI.  she understands that many of these abnormalities will need to monitored regularly along with the current treatment plan of prudent dietary changes, in which we are making each and every office visit, to improve these health parameters.  Pharmacotherapy for weight loss: She is currently taking  Metformin 500 mg daily  for medical weight loss.  Denies side effects.    Review of Systems:  Pertinent positives were addressed with patient today.  Reviewed by clinician on day of visit: allergies, medications, problem list, medical history, surgical history, family history, social history, and previous encounter notes.  Weight Summary and Biometrics   Weight Lost Since Last Visit: 6lb  No data recorded  Vitals Temp: 98.4 F (36.9 C) BP: 135/73 Pulse Rate: 79 SpO2: 97 %   Anthropometric Measurements Height: 5\' 8"  (1.727 m) Weight: 296 lb (134.3 kg) BMI (Calculated): 45.02 Weight at Last Visit: 302lb Weight Lost Since Last Visit: 6lb Starting Weight: 302lb Total Weight Loss (lbs): 6 lb (2.722 kg) Peak Weight: 307lb   Body Composition  Body Fat %: 50.9 % Fat Mass (lbs): 150.8 lbs Muscle Mass (lbs): 138 lbs Total Body Water (lbs): 101 lbs Visceral Fat Rating : 18   Other Clinical Data Fasting: no Labs: no Today's Visit #: 2 Starting Date: 05/09/23   Objective:   PHYSICAL EXAM:  Blood pressure 135/73, pulse 79, temperature 98.4 F (36.9 C), height 5\' 8"  (1.727 m), weight 296 lb (134.3 kg), last menstrual period 01/04/2010, SpO2 97%. Body mass index is 45.01  kg/m.  General: Well Developed, well nourished, and in no acute distress.  HEENT: Normocephalic, atraumatic Skin: Warm and dry, cap RF less 2 sec, good turgor Chest:  Normal excursion, shape, no gross abn Respiratory: speaking in full sentences, no conversational dyspnea NeuroM-Sk: Ambulates w/o assistance, moves * 4 Psych: A and O *3, insight good, mood-full  DIAGNOSTIC DATA REVIEWED:  BMET    Component Value Date/Time   NA 137 05/04/2023 1150   K 4.0 05/04/2023 1150   CL 104 05/04/2023 1150   CO2 28 05/04/2023 1150   GLUCOSE 112 (H) 05/04/2023 1150   BUN 13 05/04/2023 1150   CREATININE 0.78 05/04/2023 1150   CALCIUM 9.6 05/04/2023 1150   GFRNONAA >60 05/04/2023 1150   Lab Results  Component Value Date   HGBA1C 6.4 (H) 05/09/2023   HGBA1C 6.1 01/05/2020   Lab Results  Component Value Date   INSULIN 40.3 (H) 05/09/2023   Lab Results  Component Value Date   TSH 1.120 05/09/2023   CBC    Component Value Date/Time   WBC  10.9 (H) 05/04/2023 1150   WBC 9.6 01/05/2020 1017   RBC 4.76 05/04/2023 1150   RBC 4.75 05/04/2023 1150   HGB 14.2 05/04/2023 1150   HCT 42.1 05/04/2023 1150   PLT 320 05/04/2023 1150   MCV 88.6 05/04/2023 1150   MCH 29.9 05/04/2023 1150   MCHC 33.7 05/04/2023 1150   RDW 14.7 05/04/2023 1150   Iron Studies No results found for: "IRON", "TIBC", "FERRITIN", "IRONPCTSAT" Lipid Panel     Component Value Date/Time   CHOL 193 02/13/2023 0833   TRIG 110.0 02/13/2023 0833   HDL 52.70 02/13/2023 0833   CHOLHDL 4 02/13/2023 0833   VLDL 22.0 02/13/2023 0833   LDLCALC 119 (H) 02/13/2023 0833   Hepatic Function Panel     Component Value Date/Time   PROT 6.9 05/04/2023 1150   ALBUMIN 4.1 05/04/2023 1150   AST 22 05/04/2023 1150   ALT 21 05/04/2023 1150   ALKPHOS 58 05/04/2023 1150   BILITOT 0.7 05/04/2023 1150      Component Value Date/Time   TSH 1.120 05/09/2023 0936   Nutritional Lab Results  Component Value Date   VD25OH 29.6 (L)  05/09/2023    Attestations:   Reviewed by clinician on day of visit: allergies, medications, problem list, medical history, surgical history, family history, social history, and previous encounter notes.   Patient was in the office today and time spent on visit including pre-visit chart review and post-visit care/coordination of care and electronic medical record documentation was 43 minutes. 50% of the time was in face to face counseling of this patient's medical condition(s) and providing education on treatment options to include the first-line treatment of diet and lifestyle modification.  I, Connie Mosley, acting as a Stage manager for [employer], DO., have compiled all relevant documentation for today's office visit on behalf of Connie Lot, DO, while in the presence of [employer], DO.  I have reviewed the above documentation for accuracy and completeness, and I agree with the above. Connie Mosley, D.O.  The 21st Century Cures Act was signed into law in 2016 which includes the topic of electronic health records.  This provides immediate access to information in MyChart.  This includes consultation notes, operative notes, office notes, lab results and pathology reports.  If you have any questions about what you read please let us know at your next visit so we can discuss your concerns and take corrective action if need be.  We are right here with you.

## 2023-06-06 ENCOUNTER — Other Ambulatory Visit: Payer: Medicare Other

## 2023-06-06 DIAGNOSIS — N3000 Acute cystitis without hematuria: Secondary | ICD-10-CM

## 2023-06-07 LAB — URINALYSIS W MICROSCOPIC + REFLEX CULTURE
Bacteria, UA: NONE SEEN /HPF
Bilirubin Urine: NEGATIVE
Glucose, UA: NEGATIVE
Hgb urine dipstick: NEGATIVE
Hyaline Cast: NONE SEEN /LPF
Ketones, ur: NEGATIVE
Nitrites, Initial: NEGATIVE
Protein, ur: NEGATIVE
RBC / HPF: NONE SEEN /HPF (ref 0–2)
Specific Gravity, Urine: 1.015 (ref 1.001–1.035)
WBC, UA: NONE SEEN /HPF (ref 0–5)
pH: 7 (ref 5.0–8.0)

## 2023-06-07 LAB — CULTURE INDICATED

## 2023-06-08 LAB — URINE CULTURE
MICRO NUMBER:: 15518193
Result:: NO GROWTH
SPECIMEN QUALITY:: ADEQUATE

## 2023-06-12 ENCOUNTER — Ambulatory Visit (INDEPENDENT_AMBULATORY_CARE_PROVIDER_SITE_OTHER): Payer: Medicare Other | Admitting: Family Medicine

## 2023-06-23 IMAGING — MG MM DIGITAL SCREENING BILAT W/ TOMO AND CAD
8 of 15 series · 8 of 40 positions shown · non-contrast
Comparison: None.

ACR Breast Density Category a: The breast tissue is almost entirely
fatty.

CLINICAL DATA: Screening.

EXAM:
DIGITAL SCREENING BILATERAL MAMMOGRAM WITH TOMOSYNTHESIS AND CAD
TECHNIQUE: Bilateral screening digital craniocaudal and mediolateral oblique
mammograms were obtained. Bilateral screening digital breast
tomosynthesis was performed. The images were evaluated with
computer-aided detection.

[R CC synth-2D (1 of 2)]
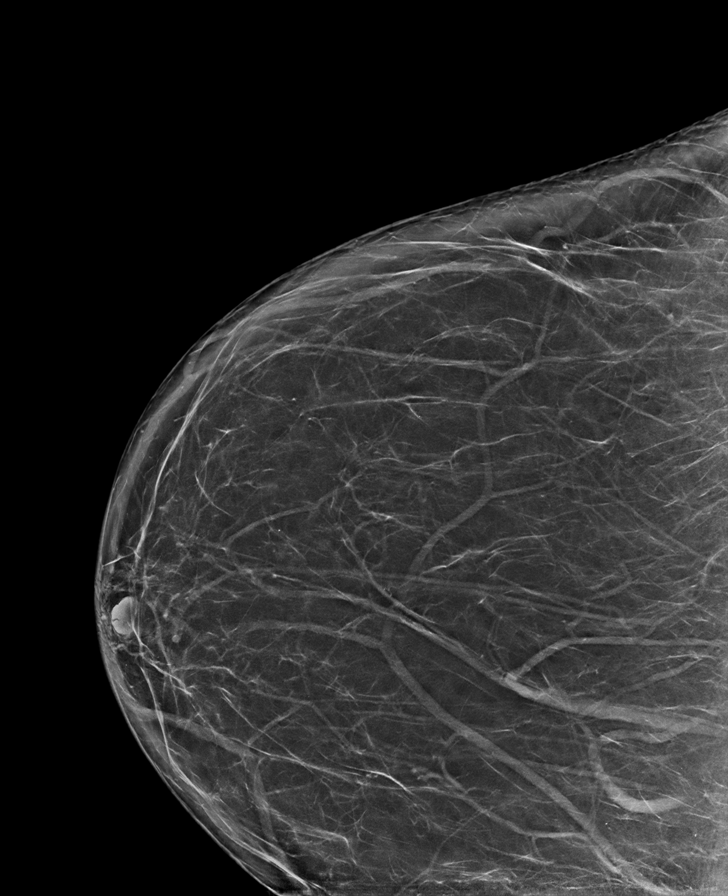

[L CC synth-2D (1 of 2)]
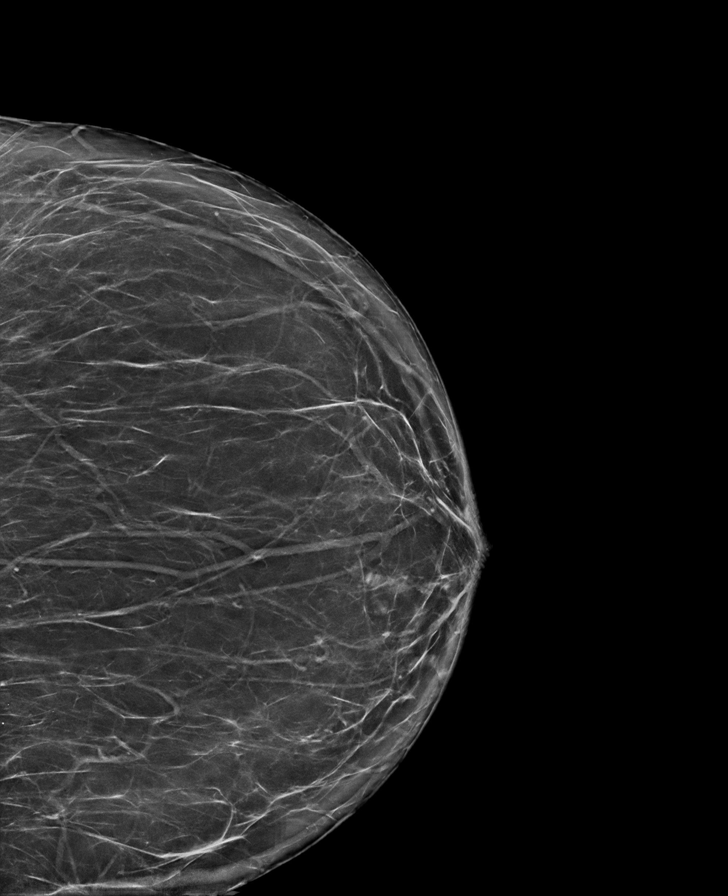

[R CC synth-2D (2 of 2)]
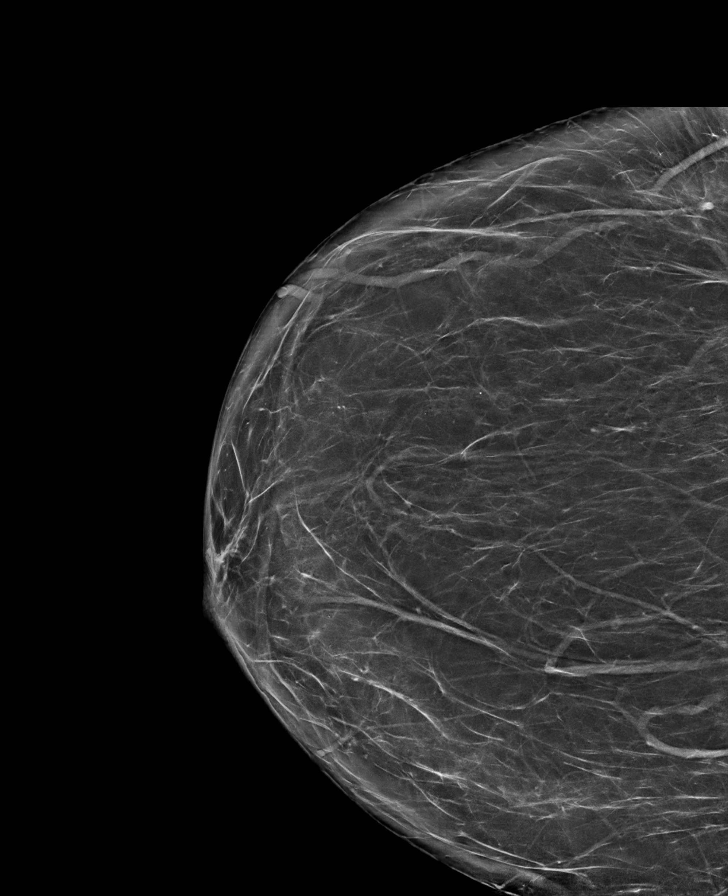

[L CC synth-2D (2 of 2)]
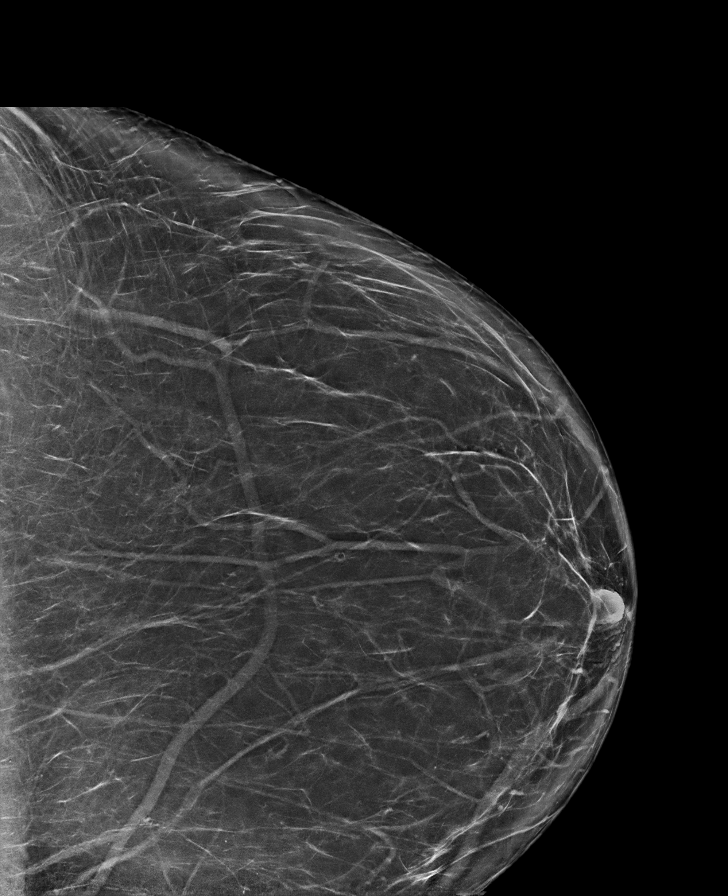

[R CV synth-2D]
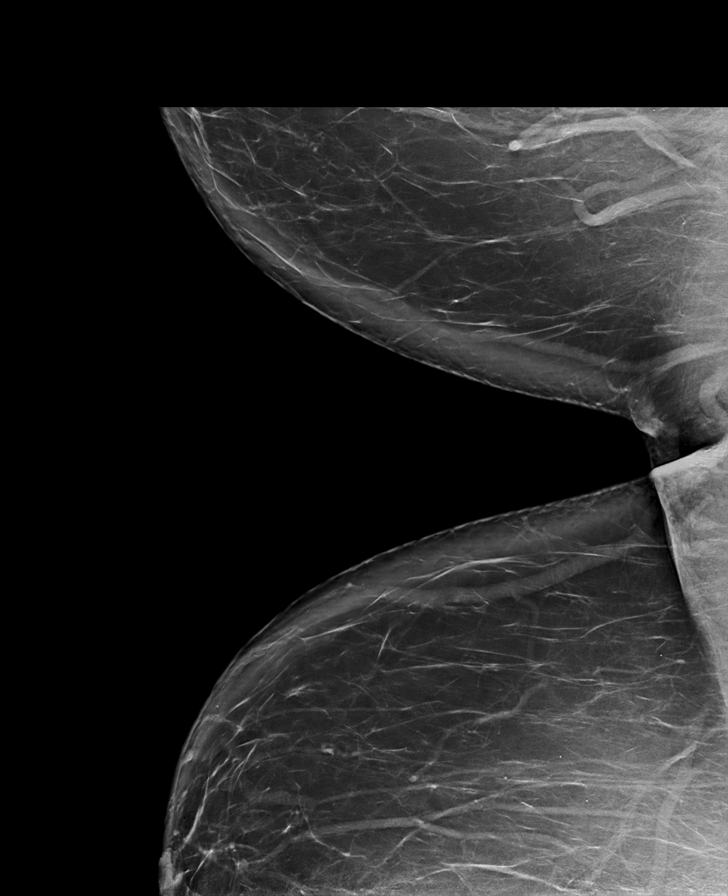

[R MLO synth-2D]
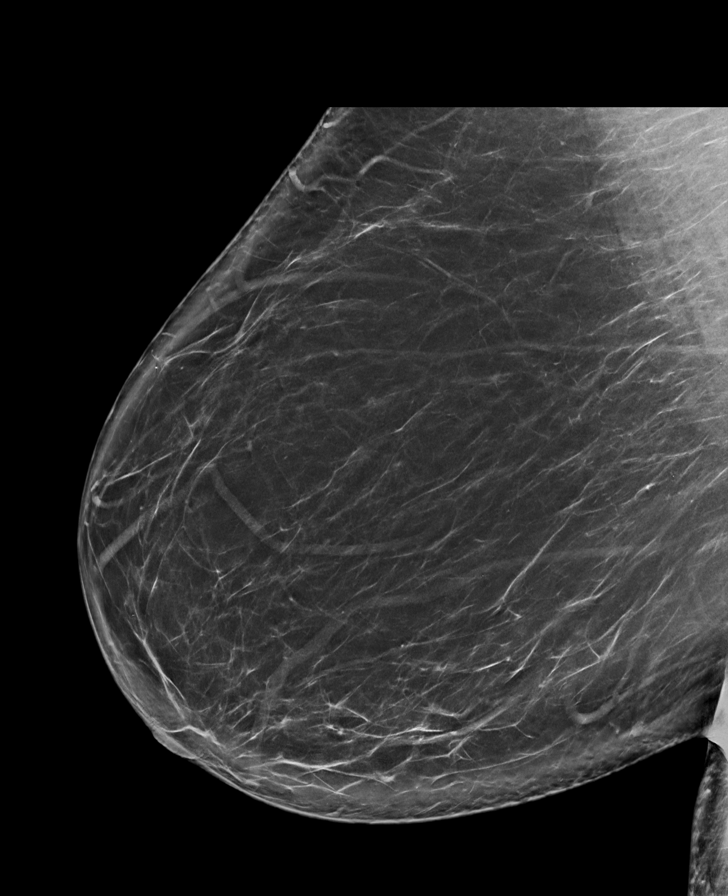

[L MLO synth-2D]
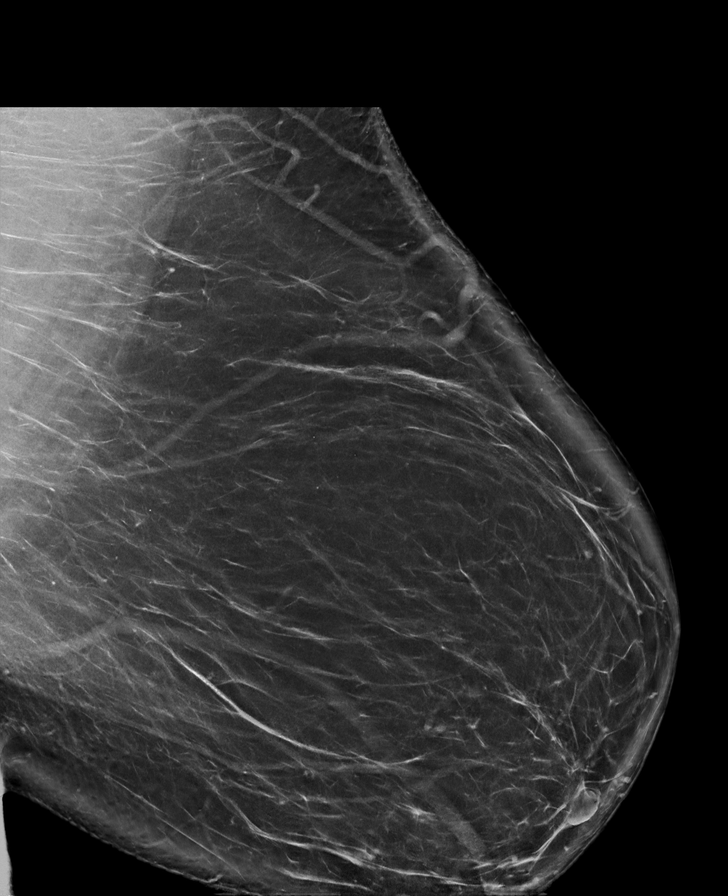

[L MLO tomo · tomo slice 70/102.0]
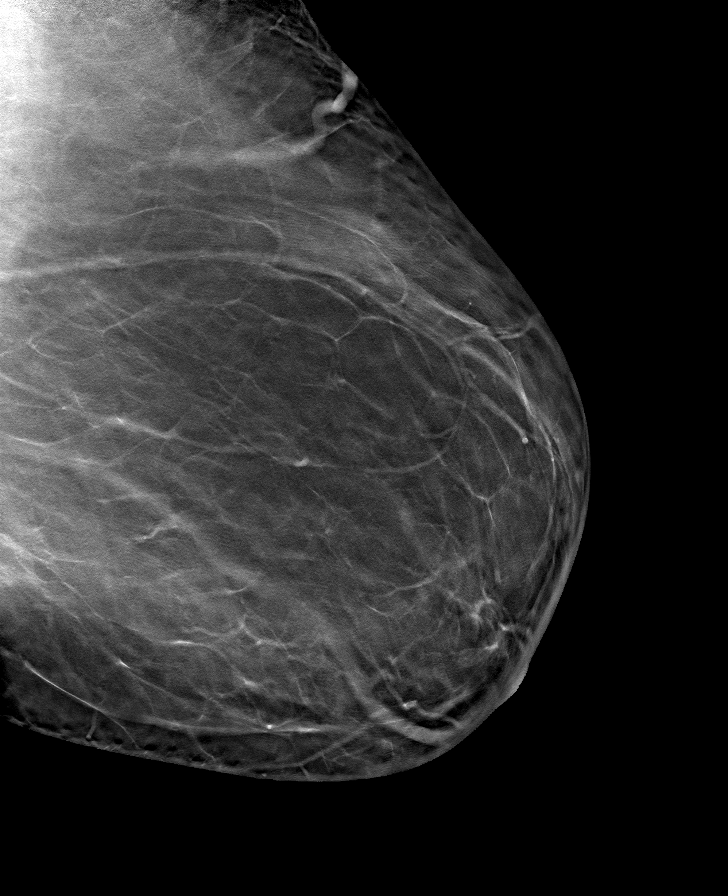

[8 of 40 positions shown; findings below may reference images not displayed]

FINDINGS: There are no findings suspicious for malignancy.
IMPRESSION: No mammographic evidence of malignancy. A result letter of this
screening mammogram will be mailed directly to the patient.

RECOMMENDATION:
Screening mammogram in one year. (Code:44-M-M6Q)

BI-RADS CATEGORY  1: Negative.

## 2023-06-26 ENCOUNTER — Other Ambulatory Visit (INDEPENDENT_AMBULATORY_CARE_PROVIDER_SITE_OTHER): Payer: Self-pay | Admitting: Family Medicine

## 2023-06-26 DIAGNOSIS — E559 Vitamin D deficiency, unspecified: Secondary | ICD-10-CM

## 2023-06-28 ENCOUNTER — Ambulatory Visit (INDEPENDENT_AMBULATORY_CARE_PROVIDER_SITE_OTHER): Payer: Medicare Other | Admitting: Family Medicine

## 2023-07-11 ENCOUNTER — Ambulatory Visit (INDEPENDENT_AMBULATORY_CARE_PROVIDER_SITE_OTHER): Payer: Medicare Other | Admitting: Physician Assistant

## 2023-07-11 ENCOUNTER — Encounter (INDEPENDENT_AMBULATORY_CARE_PROVIDER_SITE_OTHER): Payer: Self-pay | Admitting: Physician Assistant

## 2023-07-11 DIAGNOSIS — E1159 Type 2 diabetes mellitus with other circulatory complications: Secondary | ICD-10-CM

## 2023-07-11 DIAGNOSIS — E559 Vitamin D deficiency, unspecified: Secondary | ICD-10-CM

## 2023-07-11 DIAGNOSIS — Z7984 Long term (current) use of oral hypoglycemic drugs: Secondary | ICD-10-CM

## 2023-07-11 DIAGNOSIS — E1169 Type 2 diabetes mellitus with other specified complication: Secondary | ICD-10-CM

## 2023-07-11 DIAGNOSIS — E538 Deficiency of other specified B group vitamins: Secondary | ICD-10-CM

## 2023-07-11 DIAGNOSIS — I152 Hypertension secondary to endocrine disorders: Secondary | ICD-10-CM

## 2023-07-11 DIAGNOSIS — Z6841 Body Mass Index (BMI) 40.0 and over, adult: Secondary | ICD-10-CM

## 2023-07-11 MED ORDER — VITAMIN D (ERGOCALCIFEROL) 1.25 MG (50000 UNIT) PO CAPS: 50000.0000 [IU] | ORAL_CAPSULE | ORAL | 0 refills | Status: DC

## 2023-07-11 NOTE — Progress Notes (Signed)
.sm[facility]  Office: [phone]  /  Fax: [phone]  WEIGHT SUMMARY AND BIOMETRICS  Vitals Temp: 98 F (36.7 C) BP: 121/74 Pulse Rate: 82 SpO2: 98 %   Anthropometric Measurements Height: 5\' 8"  (1.727 m) Weight: 290 lb (131.5 kg) BMI (Calculated): 44.1 Weight at Last Visit: 296 lb Weight Lost Since Last Visit: 6 lb Weight Gained Since Last Visit: 0 Starting Weight: 302 lb Total Weight Loss (lbs): 12 lb (5.443 kg) Peak Weight: 307 lb   Body Composition  Body Fat %: 48.9 % Fat Mass (lbs): 142.2 lbs Muscle Mass (lbs): 141 lbs Total Body Water (lbs): 98.8 lbs Visceral Fat Rating : 17   Other Clinical Data Fasting: yes Labs: no Today's Visit #: 3 Starting Date: 05/09/23     HPI  Chief Complaint: OBESITY  Connie Mosley is here to discuss her progress with her obesity treatment plan. She is on the the Category 3 Plan with breakfast and lunch options and states she is following her eating plan approximately 50 % of the time. She states she is exercising gym 45 minutes 2 times per week.   Interval History:  Since last office visit she is down 6 lbs.  The patient, a 61 year old female with a history of type two diabetes, vitamin D and vitamin B12 deficiency, and hypertension, presents for a follow-up visit regarding her obesity treatment plan.  She reports that she has been adhering to the diet plan and has experienced weight loss, despite a recent bout of COVID-19 which disrupted her eating habits.  She expresses satisfaction with the diet plan, stating that she feels satisfied and not excessively hungry.  She also mentions that she has been able to find and adjust to new recipes that align with her nutrition plan. The patient also reports that she was not consuming sufficient protein prior to starting the diet plan. She is also on a vitamin D supplement, which she needs a refill for.   Pharmacotherapy: metformin 500 mg daily for Type 2 diabetes per PCP. No GI side effects.    TREATMENT PLAN FOR OBESITY: Obesity Significant progress in weight loss program despite recent challenges including COVID-19 infection and dietary changes due to family visit.  BMI decreased to 44, muscle mass increased, adipose decreased from 150.8 to 142.2, and visceral adipose rating decreased from 20 to 17. Patient reports satisfaction with current eating plan and is not experiencing excessive hunger or cravings. -Continue current eating plan. -Next appointment in 4 weeks to monitor progress per patient's preference. Recommended Dietary Goals  Connie Mosley is currently in the action stage of change. As such, her goal is to continue weight management plan. She has agreed to the Category 3 Plan.with breakfast and lunch options.  Behavioral Intervention  We discussed the following Behavioral Modification Strategies today: continue to work on maintaining a reduced calorie state, getting the recommended amount of protein, incorporating whole foods, making healthy choices, staying well hydrated and practicing mindfulness when eating..  Additional resources provided today: NA  Recommended Physical Activity Goals  Connie Mosley has been advised to work up to 150 minutes of moderate intensity aerobic activity a week and strengthening exercises 2-3 times per week for cardiovascular health, weight loss maintenance and preservation of muscle mass.   She has agreed to Think about enjoyable ways to increase daily physical activity and overcoming barriers to exercise and Increase physical activity in their day and reduce sedentary time (increase NEAT).   Pharmacotherapy We discussed various medication options to help Connie Mosley with her weight loss  efforts and we both agreed to continue metformin for Type 2 diabetes and continue to work on nutritional and behavioral strategies to promote weight loss.    Return in about 4 weeks (around 08/08/2023).Marland Kitchen She was informed of the importance of frequent follow up visits to  maximize her success with intensive lifestyle modifications for her multiple health conditions.  PHYSICAL EXAM:  Blood pressure 121/74, pulse 82, temperature 98 F (36.7 C), height 5\' 8"  (1.727 m), weight 290 lb (131.5 kg), last menstrual period 01/04/2010, SpO2 98%. Body mass index is 44.09 kg/m.  General: She is overweight, cooperative, alert, well developed, and in no acute distress. PSYCH: Has normal mood, affect and thought process.   Cardiovascular: HR 80's BP 121/74 Lungs: Normal breathing effort, no conversational dyspnea. Neuro: no focal deficits  DIAGNOSTIC DATA REVIEWED:  BMET    Component Value Date/Time   NA 137 05/04/2023 1150   K 4.0 05/04/2023 1150   CL 104 05/04/2023 1150   CO2 28 05/04/2023 1150   GLUCOSE 112 (H) 05/04/2023 1150   BUN 13 05/04/2023 1150   CREATININE 0.78 05/04/2023 1150   CALCIUM 9.6 05/04/2023 1150   GFRNONAA >60 05/04/2023 1150   Lab Results  Component Value Date   HGBA1C 6.4 (H) 05/09/2023   HGBA1C 6.1 01/05/2020   Lab Results  Component Value Date   INSULIN 40.3 (H) 05/09/2023   Lab Results  Component Value Date   TSH 1.120 05/09/2023   CBC    Component Value Date/Time   WBC 10.9 (H) 05/04/2023 1150   WBC 9.6 01/05/2020 1017   RBC 4.76 05/04/2023 1150   RBC 4.75 05/04/2023 1150   HGB 14.2 05/04/2023 1150   HCT 42.1 05/04/2023 1150   PLT 320 05/04/2023 1150   MCV 88.6 05/04/2023 1150   MCH 29.9 05/04/2023 1150   MCHC 33.7 05/04/2023 1150   RDW 14.7 05/04/2023 1150   Iron Studies No results found for: "IRON", "TIBC", "FERRITIN", "IRONPCTSAT" Lipid Panel     Component Value Date/Time   CHOL 193 02/13/2023 0833   TRIG 110.0 02/13/2023 0833   HDL 52.70 02/13/2023 0833   CHOLHDL 4 02/13/2023 0833   VLDL 22.0 02/13/2023 0833   LDLCALC 119 (H) 02/13/2023 0833   Hepatic Function Panel     Component Value Date/Time   PROT 6.9 05/04/2023 1150   ALBUMIN 4.1 05/04/2023 1150   AST 22 05/04/2023 1150   ALT 21  05/04/2023 1150   ALKPHOS 58 05/04/2023 1150   BILITOT 0.7 05/04/2023 1150      Component Value Date/Time   TSH 1.120 05/09/2023 0936   Nutritional Lab Results  Component Value Date   VD25OH 29.6 (L) 05/09/2023    ASSOCIATED CONDITIONS ADDRESSED TODAY  ASSESSMENT AND PLAN  Problem List Items Addressed This Visit   None Visit Diagnoses     Type 2 diabetes mellitus with morbid obesity (HCC)    -  Primary   Vitamin D deficiency - new onset       Relevant Medications   Vitamin D, Ergocalciferol, (DRISDOL) 1.25 MG (50000 UNIT) CAPS capsule   B12 deficiency       Hypertension associated with diabetes (HCC)       Obesity (HCC)- Start BMI 45.92           Type 2 Diabetes Mellitus with other specified complication, without long-term current use of insulin HgbA1c is not at goal. Last A1c was 6.4  Medication(s): Metformin 500 mg once daily breakfast No GI or  other side effects.  She is working  on nutrition plan to decrease simple carbohydrates, increase lean proteins and exercise to promote weight loss and improve glycemic control .   Lab Results  Component Value Date   HGBA1C 6.4 (H) 05/09/2023   HGBA1C 6.5 02/13/2023   HGBA1C 6.8 (H) 08/09/2022   Lab Results  Component Value Date   MICROALBUR 2.2 (H) 02/13/2023   LDLCALC 119 (H) 02/13/2023   CREATININE 0.78 05/04/2023   Lab Results  Component Value Date   GFR 71.43 02/13/2023   GFR 66.99 08/09/2022   GFR 73.93 01/05/2020    Plan: Continue Metformin 500 mg once daily breakfast as prescribed by PCP.  Continue working  on nutrition plan to decrease simple carbohydrates, increase lean proteins and exercise to promote weight loss and improve glycemic control .  Vitamin D Deficiency Vitamin D is not at goal of 50.  Most recent vitamin D level was 29.6. She is on  prescription ergocalciferol 50,000 IU weekly. Lab Results  Component Value Date   VD25OH 29.6 (L) 05/09/2023    Plan: Continue and refill   prescription ergocalciferol 50,000 IU weekly Low vitamin D levels can be associated with adiposity and may result in leptin resistance and weight gain. Also associated with fatigue. Currently on vitamin D supplementation without any adverse effects.  Recheck vitamin D level several times yearly to optimize supplementation/avoid over supplementation.   B 12 deficiency: B 12 level < 500 at 399 last check.  Taking B 12 injection per PCP every 14 days. No side effects.  Plan: Continue B 12 injection per PCP.  Periodic recheck of B 12 level with goal of > 500.   Hypertension Hypertension asymptomatic, reasonably well controlled, and no significant medication side effects noted.  Medication(s): Lisinopril-hydrochlorothiazide 20/25 mg daily. Renal function in normal range  BP Readings from Last 3 Encounters:  07/11/23 121/74  06/05/23 135/73  05/16/23 122/70   Lab Results  Component Value Date   CREATININE 0.78 05/04/2023   CREATININE 0.86 02/13/2023   CREATININE 0.86 02/02/2023   Lab Results  Component Value Date   GFR 71.43 02/13/2023   GFR 66.99 08/09/2022   GFR 73.93 01/05/2020    Plan: Continue all antihypertensives at current dosages per PCP.  Continue to work on nutrition plan to promote weight loss and improve BP control.   General Health Maintenance / Followup Plans -Encouraged patient to reach out via MyChart with any questions or concerns before next appointment. -Next appointment scheduled for 12:15 on Tuesday, August 07, 2023.  ATTESTASTION STATEMENTS:  Reviewed by clinician on day of visit: allergies, medications, problem list, medical history, surgical history, family history, social history, and previous encounter notes.   I have personally spent 33 minutes total time today in preparation, patient care, nutritional counseling and documentation for this visit, including the following: review of clinical lab tests; review of medical tests/procedures/services.       Marene Gilliam, PA-C

## 2023-08-07 ENCOUNTER — Ambulatory Visit (INDEPENDENT_AMBULATORY_CARE_PROVIDER_SITE_OTHER): Payer: Medicare Other | Admitting: Physician Assistant

## 2023-08-07 ENCOUNTER — Encounter (INDEPENDENT_AMBULATORY_CARE_PROVIDER_SITE_OTHER): Payer: Self-pay | Admitting: Physician Assistant

## 2023-08-07 DIAGNOSIS — M1711 Unilateral primary osteoarthritis, right knee: Secondary | ICD-10-CM | POA: Diagnosis not present

## 2023-08-07 DIAGNOSIS — E559 Vitamin D deficiency, unspecified: Secondary | ICD-10-CM

## 2023-08-07 DIAGNOSIS — Z7984 Long term (current) use of oral hypoglycemic drugs: Secondary | ICD-10-CM

## 2023-08-07 DIAGNOSIS — K5901 Slow transit constipation: Secondary | ICD-10-CM | POA: Diagnosis not present

## 2023-08-07 DIAGNOSIS — Z6841 Body Mass Index (BMI) 40.0 and over, adult: Secondary | ICD-10-CM | POA: Insufficient documentation

## 2023-08-07 DIAGNOSIS — E1169 Type 2 diabetes mellitus with other specified complication: Secondary | ICD-10-CM | POA: Insufficient documentation

## 2023-08-07 MED ORDER — VITAMIN D (ERGOCALCIFEROL) 1.25 MG (50000 UNIT) PO CAPS: 50000.0000 [IU] | ORAL_CAPSULE | ORAL | 0 refills | Status: DC

## 2023-08-07 NOTE — Progress Notes (Signed)
SUBJECTIVE:  Chief Complaint: Obesity Discussed the use of AI scribe software for clinical note transcription with the patient, who gave verbal consent to proceed.  History of Present Illness     Interim History: Connie Mosley is a 62 year old female who has been following an obesity treatment plan.  She is down 2 lbs since her last visit.  She is experiencing constipation, which she attributes to a lack of dietary fiber. The patient has been exploring different dietary options to increase fiber intake and manage her weight. She expresses a desire to incorporate more fruits into her diet, and we discussed and provided a hand out for low glycemic index fruits. However, she expresses fatigue with her current fruit options, namely berries and apples, and expresses interest in incorporating raisins, prunes, and oranges and we discussed limiting these options due to the high sugar content.   The patient has been experimenting with smoothies as a quick and convenient meal option, particularly for breakfast. She is interested in incorporating protein powders into her smoothies but is unsure about the potential additives in these products. She has also been considering incorporating oatmeal into her diet but is mindful of the potential carbohydrate content.  Provided recipes and options for making her own smoothies for breakfast and she is going to try these- see below.   The patient has been exploring various recipes, including a high-protein pancake recipe that incorporates cottage cheese and eggs. She is also considering incorporating soups into her diet, particularly those advertised as high-protein options. She has been mindful of her protein intake, aiming for around 100 grams per day within a 1500-1600 calorie limit.  The patient has been managing her cravings for chocolate by allowing herself a single Hershey's Kiss when the craving arises. She reports that this strategy has been effective in  preventing binge eating. She is also planning for the upcoming holiday season, intending to portion control her meals to prevent overeating.  The patient reports difficulty with physical activity due to a sore knee that requires a knee replacement. She has been using a stationary bike for exercise, but the knee pain has limited her activity to about 20 minutes, two to three times a week. She is considering exploring options at a local gym once her insurance situation is resolved.  The patient's weight loss journey has been marked by a decrease in her BMI from 47.5 to 43.8. She is aiming for a BMI under 40 to qualify for a knee replacement surgery. The patient is committed to this lifestyle change and is actively seeking ways to diversify her diet while meeting her nutritional goals.  Connie Mosley is here to discuss her progress with her obesity treatment plan. She is on the Category 3 Plan and states she is following her eating plan approximately 60 % of the time. She states she is exercising stationary bike 20 minutes 2-3 times per week.      OBJECTIVE: Visit Diagnoses: Problem List Items Addressed This Visit     Morbid obesity (HCC)   Unilateral primary osteoarthritis, right knee   Type 2 diabetes mellitus with morbid obesity (HCC) - Primary   Vitamin D deficiency - new onset   Relevant Medications   Vitamin D, Ergocalciferol, (DRISDOL) 1.25 MG (50000 UNIT) CAPS capsule   BMI 40.0-44.9, adult (HCC) Current BMI 43.9  Obesity Down 14 lbs since 05/09/23 TBW loss 43.24%  62 year old actively engaged in dietary modifications, down 2 pounds since last visit. Current BMI 43.8, needs BMI under 40  for knee replacement surgery. Experiencing knee pain, using stationary bike for exercise. Discussed dietary strategies including high-protein, low-glycemic index fruits, homemade smoothies, and high-protein soups. Emphasized portion control during holidays. Protein and calorie goals: 1500-1600 calories and 100  grams of protein daily. - Continue dietary modifications focusing on high-protein, low-glycemic index fruits such as berries and apples. - Allow limited prunes and raisins for constipation. - Recommend homemade smoothies with protein powder, Fairlife milk, yogurt, and berries. - Advise 1500-1600 calories and 100 grams of protein daily. - Suggest experimenting with different protein powders. - Encourage high-protein soups like Progresso black bean soup, supplemented with Fairlife milk to meet protein needs. - Incorporate oatmeal with milk for additional fiber and protein. - Recommend high-protein recipes such as protein pancakes with protein powder and Greek yogurt. - Advise portion control during holidays. - Encourage stationary bike use, 20 minutes, 2-3 times a week. - Follow-up mid-January per patient request.   Type 2 Diabetes Mellitus with other specified complication, without long-term current use of insulin HgbA1c is at goal. Last A1c was 6.4  Medication(s): Metformin 500 mg once daily breakfast No GI side effects. Prescribed by PCP.  She is working  on nutrition plan to decrease simple carbohydrates, increase lean proteins and exercise to promote weight loss and improve glycemic control .   Lab Results  Component Value Date   HGBA1C 6.4 (H) 05/09/2023   HGBA1C 6.5 02/13/2023   HGBA1C 6.8 (H) 08/09/2022   Lab Results  Component Value Date   MICROALBUR 2.2 (H) 02/13/2023   LDLCALC 119 (H) 02/13/2023   CREATININE 0.78 05/04/2023   Lab Results  Component Value Date   GFR 71.43 02/13/2023   GFR 66.99 08/09/2022   GFR 73.93 01/05/2020    Plan: Continue Metformin 500 mg once daily breakfast per PCP.  Continue working  on nutrition plan to decrease simple carbohydrates, increase lean proteins and exercise to promote weight loss and improve glycemic control . Could consider Mounjaro or Ozempic if having difficulty meeting goal of BMI <40 with limitations of movement, but has  made good progress thus far .   Vitamin D Deficiency Vitamin D is not at goal of 50.  Most recent vitamin D level was 29.6. She is on  prescription ergocalciferol 50,000 IU weekly. Lab Results  Component Value Date   VD25OH 29.6 (L) 05/09/2023    Plan: Continue and refill  prescription ergocalciferol 50,000 IU weekly Low vitamin D levels can be associated with adiposity and may result in leptin resistance and weight gain. Also associated with fatigue. Currently on vitamin D supplementation without any adverse effects.  Recheck vitamin D level several times yearly to optimize supplementation/avoid over supplementation.   Knee Pain/ Right knee OA Significant knee pain limiting physical activity. Awaiting knee replacement surgery, needs BMI under 40. Using stationary bike for low-impact exercise. - Encourage stationary bike for low-impact exercise. - Discuss weight loss importance for knee replacement surgery.  Constipation Difficulty with constipation, likely due to insufficient fiber intake and increased protein intake. Fiber supplements like Metamucil not effective but also discussed making sure she is taking in at least 80-100 oz of fluids daily.  Discussed incorporating prunes and high-fiber foods. - Allow limited prunes for constipation. - Encourage high-fiber foods such as oatmeal and high-fiber fruits. - Suggest experimenting with different fiber supplements or consider use of miralax if no improvement.   General Health Maintenance Actively engaged in lifestyle modifications to manage weight and improve overall health. Discussed dietary strategies and portion  control during holidays. - Encourage high-protein, low-glycemic index diet. - Advise portion control and mindful eating during holidays. - Recommend regular physical activity within limitations. Discussed ideas/recipes for protein shakes: Ideas for Protein Shakes   High protein vegetables:  Spinach or Kale- 1/4-1/2  cup Fruit:  Fresh or frozen fruit- 1/2 cup  Protein Sources:   Greek yogurt Cottage cheese Fairlife milk Kefir  Nuts: Almonds- 2 Tbsp Peanuts- 2 Tbsp Cashews- 2 Tbsp Nut butter- 1 Tbsp  Seeds: Chia seeds- 1 Tbsp Flax seeds- 1 Tbsp Hemp seeds- 1 Tbsp   Basic Protein Shake Recipe  1 cup of frozen fruit 1/2 - 1 cup (Fairlife milk, yogurt or unsweetened almond milk) 1 scoop of protein powder (20-30 grams of protein per scoop) Optional:  artificial sweeteners (to taste), ice, water, and other ingredients listed on this sheet.  Breakfast Protein Shake  1 and 1/2 cups Fairlife Skim Milk 1 and 1/2 cups Ice 1 Greek yogurt cup 1/2 cup frozen berries 4-5 packets Splenda (to your taste)  This shake will fit in a 40 oz cup and is around 300 calories with 32 grams of protein.    Enjoy!      Follow-up - Follow-up mid-January.  Vitals Temp: 98.4 F (36.9 C) BP: 119/75 Pulse Rate: 71 SpO2: 97 %   Anthropometric Measurements Height: 5\' 8"  (1.727 m) Weight: 288 lb (130.6 kg) BMI (Calculated): 43.8 Weight at Last Visit: 290 lb Weight Lost Since Last Visit: 2 lb Weight Gained Since Last Visit: 0 Starting Weight: 302 lb Total Weight Loss (lbs): 14 lb (6.35 kg) Peak Weight: 307 lb   Body Composition  Body Fat %: 53.6 % Fat Mass (lbs): 154.6 lbs Muscle Mass (lbs): 127.2 lbs Total Body Water (lbs): 99.4 lbs Visceral Fat Rating : 19   Other Clinical Data Fasting: no Labs: no Today's Visit #: 4 Starting Date: 05/09/23     ASSESSMENT AND PLAN:  Diet: Anatasia is currently in the action stage of change. As such, her goal is to continue with weight loss efforts. She has agreed to Category 3 Plan and keeping a food journal and adhering to recommended goals of 1500-1600 calories and 100 grams of  protein.  Exercise: Connie Mosley has been instructed to continue exercising as is for weight loss and overall health benefits.   Behavior Modification:  We discussed the  following Behavioral Modification Strategies today: increasing lean protein intake, decreasing simple carbohydrates, increasing vegetables, increase H2O intake, increase high fiber foods, meal planning and cooking strategies, holiday eating strategies, and keep a strict food journal. We discussed various medication options to help Connie Mosley with her weight loss efforts and we both agreed to continue to work on nutritional and behavioral strategies to promote weight loss.    Return in about 8 weeks (around 10/02/2023).Marland Kitchen She was informed of the importance of frequent follow up visits to maximize her success with intensive lifestyle modifications for her multiple health conditions.  Attestation Statements:   Reviewed by clinician on day of visit: allergies, medications, problem list, medical history, surgical history, family history, social history, and previous encounter notes.   Time spent on visit including pre-visit chart review and post-visit care and charting was 45 minutes.    Teller Wakefield, PA-C

## 2023-08-16 ENCOUNTER — Ambulatory Visit: Payer: Medicare Other | Admitting: Family Medicine

## 2023-08-16 VITALS — BP 130/84 | HR 84 | Temp 98.3°F | Ht 68.0 in | Wt 292.2 lb

## 2023-08-16 DIAGNOSIS — I10 Essential (primary) hypertension: Secondary | ICD-10-CM

## 2023-08-16 DIAGNOSIS — M1711 Unilateral primary osteoarthritis, right knee: Secondary | ICD-10-CM

## 2023-08-16 DIAGNOSIS — Z7984 Long term (current) use of oral hypoglycemic drugs: Secondary | ICD-10-CM

## 2023-08-16 DIAGNOSIS — Z5309 Procedure and treatment not carried out because of other contraindication: Secondary | ICD-10-CM | POA: Diagnosis not present

## 2023-08-16 DIAGNOSIS — L84 Corns and callosities: Secondary | ICD-10-CM

## 2023-08-16 DIAGNOSIS — E1169 Type 2 diabetes mellitus with other specified complication: Secondary | ICD-10-CM | POA: Diagnosis not present

## 2023-08-16 LAB — GLUCOSE, RANDOM: Glucose, Bld: 122 mg/dL — ABNORMAL HIGH (ref 70–99)

## 2023-08-16 LAB — HM DIABETES EYE EXAM

## 2023-08-16 LAB — HEMOGLOBIN A1C: Hgb A1c MFr Bld: 6.3 % (ref 4.6–6.5)

## 2023-08-16 NOTE — Assessment & Plan Note (Signed)
Blood pressure is adequately controlled. Continue lisinopril-HCTZ (Zestoretic) 20-25 mg daily.

## 2023-08-16 NOTE — Assessment & Plan Note (Signed)
Has been in good control. We will reassess her A1c today. Continue metformin 500 mg daily.

## 2023-08-16 NOTE — Assessment & Plan Note (Signed)
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 She notes her hematologist in [place] had recommended she not take statins due to risk for inducing hemolysis. She will check with Dr. Leonides Schanz to confirm this recommendation.

## 2023-08-16 NOTE — Progress Notes (Signed)
[facility] [address] [place] [address] Dept: [phone] Dept Fax: [phone]  Chronic Care Office Visit  Subjective:    Patient ID: Connie Mosley, female    DOB: [birth year], 62 y.o..   MRN: [id number]  Chief Complaint  Patient presents with   Diabetes    3 month f/u.  C/o having a spot on RT foot.     History of Present Illness:  Patient is in today for reassessment of chronic medical issues.  Ms. Sessoms has essential hypertension. She is managed on lisinopril-HCTZ (Zestoretic) 20-25 mg daily.    Ms. Crutchley has a history of right knee osteoarthritis. Orthopedics has recommended she have a knee joint replacement, but wants her to lose 40 lbs before proceeding with surgery. Ms. Pankiewicz is now being [facility]. She is working at weight loss without the use of medication.   Ms. Belrose has Type 2 diabetes. She is managed on metformin 500 mg daily.   Past Medical History: Patient Active Problem List   Diagnosis Date Noted   Medical contraindication to statin therapy 08/16/2023   Type 2 diabetes mellitus with morbid obesity (HCC) 08/07/2023   Vitamin D deficiency - new onset 08/07/2023   BMI 40.0-44.9, adult (HCC) Current BMI 43.9 08/07/2023   Acute cystitis without hematuria 05/16/2023   Type 2 diabetes mellitus with diabetic polyneuropathy, without long-term current use of insulin (HCC) 03/27/2023   Diabetic polyneuropathy (HCC) 02/13/2023   Hammer toes, bilateral 02/13/2023   PVCs (premature ventricular contractions) 01/17/2022   GERD (gastroesophageal reflux disease) 01/17/2022   Unilateral primary osteoarthritis, right knee 02/14/2021   Insulin resistance 01/05/2020   History of non anemic vitamin B12 deficiency 04/22/2018   Morbid obesity (HCC) 06/26/2017   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 2 mL 11   diclofenac (VOLTAREN) 75 MG EC tablet Take 1 tablet (75 mg total) by mouth 2 (two) times daily. 60 tablet 1   folic acid (FOLVITE) 1 MG tablet Take 1 tablet (1 mg total) by mouth daily. 90 tablet 5   lisinopril-hydrochlorothiazide (ZESTORETIC) 20-25 MG tablet TAKE 1 TABLET BY MOUTH EVERY DAY 90 tablet 3   metFORMIN (GLUCOPHAGE) 500 MG tablet TAKE 1 TABLET BY MOUTH EVERY DAY WITH BREAKFAST 90 tablet 3   naproxen (NAPROSYN) 500 MG tablet Take 1 tablet (500 mg total) by mouth 2 (two) times daily with a meal. 14 tablet 0   Vitamin D, Ergocalciferol, (DRISDOL) 1.25 MG (50000 UNIT) CAPS capsule Take 1 capsule (50,000 Units total) by mouth every 7 (seven) days. 12 capsule 0   No facility-administered medications prior to visit.   Allergies  Allergen Reactions   Cefuroxime Axetil Itching   Sulfamethoxazole-Trimethoprim Other (See Comments)    Reacts with Bone Marrow per Hemotologist    Cefuroxime Hives and Rash   Objective:   Today's Vitals   08/16/23 0754  BP: 130/84  Pulse: 84  Temp: 98.3 F (36.8 C)  TempSrc: Temporal  SpO2: 98%  Weight: 292 lb 3.2 oz (132.5 kg)  Height: 5\' 8"  (1.727 m)   Body mass index is 44.43 kg/m.   General: Well developed, well nourished. No acute distress. Foot: There is an area of thickening and mild brown discoloration at the dorsal base of the right 4th toe.  Psych: Alert and oriented. Normal mood and affect.  Health Maintenance Due  Topic Date Due    OPHTHALMOLOGY EXAM  Never done   HIV Screening  Never done   Hepatitis C Screening  Never done     Assessment & Plan:   Problem List Items Addressed This Visit       Cardiovascular and Mediastinum   Essential hypertension - Primary    Blood pressure is adequately controlled. Continue lisinopril-HCTZ (Zestoretic) 20-25 mg daily.         Endocrine   Type 2 diabetes mellitus with morbid obesity (HCC)    Has been in good control. We will reassess her A1c today. Continue metformin 500 mg daily.      Relevant Orders   Glucose, random   Hemoglobin A1c     Musculoskeletal and Integument   Unilateral primary osteoarthritis, right knee    Stable. Using diclofenac 75 mg bid for knee pain. Ultimately knee joint replacement is needed. Discussed substituting Tylenol 500 mg 2 tabs/caps daily for one dose of the diclofenac.        Other   Medical contraindication to statin therapy    Ms. Hoben has a history of idiopathic autoimmune hemolytic anemia. She notes her hematologist in [place] had recommended she not take statins due to risk for inducing hemolysis. She will check with Dr. Leonides Schanz to confirm this recommendation.      Morbid obesity (HCC)    Maximum weight: 326 lbs (12/2019) Current weight: 292 lbs Weight change since last visit: - 12 lbs Total weight loss: 34 lbs (10.4 %)- 12 lbs since recommendation by orthopedics  Congratulated Ms. Muratore on her efforts. She will continue efforts with a goal weight of 264 lb. for surgery.      Other Visit Diagnoses     Callus of toe       Reassured. We will monitor.       Return in about 3 months (around 11/14/2023) for Reassessment.   Loyola Mast, MD

## 2023-08-16 NOTE — Assessment & Plan Note (Signed)
Stable. Using diclofenac 75 mg bid for knee pain. Ultimately knee joint replacement is needed. Discussed substituting Tylenol 500 mg 2 tabs/caps daily for one dose of the diclofenac.

## 2023-08-16 NOTE — Assessment & Plan Note (Signed)
Maximum weight: 326 lbs (12/2019) Current weight: 292 lbs Weight change since last visit: - 12 lbs Total weight loss: 34 lbs (10.4 %)- 12 lbs since recommendation by orthopedics  Congratulated Connie Mosley on her efforts. She will continue efforts with a goal weight of 264 lb. for surgery.

## 2023-08-17 ENCOUNTER — Encounter: Payer: Self-pay | Admitting: Family Medicine

## 2023-08-31 NOTE — Telephone Encounter (Signed)
Results are in media. Sending to HIM to correct the scanned document.

## 2023-08-31 NOTE — Telephone Encounter (Signed)
Practice sent letter for eye exam notes on 08/16/2023.   Care Team updated.

## 2023-09-25 ENCOUNTER — Other Ambulatory Visit: Payer: Self-pay | Admitting: *Deleted

## 2023-09-26 ENCOUNTER — Encounter: Payer: Self-pay | Admitting: Family Medicine

## 2023-09-26 ENCOUNTER — Other Ambulatory Visit: Payer: Self-pay | Admitting: *Deleted

## 2023-09-26 DIAGNOSIS — I10 Essential (primary) hypertension: Secondary | ICD-10-CM

## 2023-09-26 DIAGNOSIS — E88819 Insulin resistance, unspecified: Secondary | ICD-10-CM

## 2023-09-26 MED ORDER — SYRINGE 20G X 1" 3 ML MISC: 1.0000 mL | 1 refills | Status: AC

## 2023-09-26 MED ORDER — METFORMIN HCL 500 MG PO TABS: 500.0000 mg | ORAL_TABLET | Freq: Every day | ORAL | 3 refills | Status: DC

## 2023-09-26 MED ORDER — LISINOPRIL-HYDROCHLOROTHIAZIDE 20-25 MG PO TABS: 1.0000 | ORAL_TABLET | Freq: Every day | ORAL | 3 refills | Status: DC

## 2023-09-29 ENCOUNTER — Other Ambulatory Visit: Payer: Self-pay | Admitting: Family Medicine

## 2023-09-29 DIAGNOSIS — M1711 Unilateral primary osteoarthritis, right knee: Secondary | ICD-10-CM

## 2023-10-01 ENCOUNTER — Other Ambulatory Visit (INDEPENDENT_AMBULATORY_CARE_PROVIDER_SITE_OTHER): Payer: Self-pay | Admitting: Physician Assistant

## 2023-10-01 DIAGNOSIS — E559 Vitamin D deficiency, unspecified: Secondary | ICD-10-CM

## 2023-10-01 MED ORDER — VITAMIN D (ERGOCALCIFEROL) 1.25 MG (50000 UNIT) PO CAPS: 50000.0000 [IU] | ORAL_CAPSULE | ORAL | 0 refills | Status: AC

## 2023-10-02 ENCOUNTER — Ambulatory Visit (INDEPENDENT_AMBULATORY_CARE_PROVIDER_SITE_OTHER): Payer: Self-pay | Admitting: Physician Assistant

## 2023-10-02 ENCOUNTER — Other Ambulatory Visit: Payer: Self-pay | Admitting: *Deleted

## 2023-10-02 DIAGNOSIS — D5919 Other autoimmune hemolytic anemia: Secondary | ICD-10-CM

## 2023-10-02 MED ORDER — CYANOCOBALAMIN 1000 MCG/ML IJ SOLN: 1000.0000 ug | INTRAMUSCULAR | 0 refills | Status: DC

## 2023-10-02 MED ORDER — FOLIC ACID 1 MG PO TABS: 1.0000 mg | ORAL_TABLET | Freq: Every day | ORAL | 0 refills | Status: DC

## 2023-10-02 NOTE — Telephone Encounter (Signed)
Contacted by [facility] with request for new 90 day rx for Folic Acid 1mg  and Cyanocobalamin (Vitamin B12) 1000 mcg/ml injection. Patient's insurance changed and this is new pharmacy RX sent

## 2023-10-03 ENCOUNTER — Telehealth: Payer: Self-pay | Admitting: Family Medicine

## 2023-10-03 NOTE — Telephone Encounter (Signed)
 ERROR

## 2023-11-05 ENCOUNTER — Ambulatory Visit (INDEPENDENT_AMBULATORY_CARE_PROVIDER_SITE_OTHER): Payer: Self-pay | Admitting: Physician Assistant

## 2023-11-14 ENCOUNTER — Ambulatory Visit: Payer: Medicare Other | Admitting: Family Medicine

## 2023-11-15 ENCOUNTER — Encounter (INDEPENDENT_AMBULATORY_CARE_PROVIDER_SITE_OTHER): Payer: Self-pay

## 2023-11-20 ENCOUNTER — Ambulatory Visit (INDEPENDENT_AMBULATORY_CARE_PROVIDER_SITE_OTHER): Payer: Medicare Other | Admitting: Family Medicine

## 2023-11-20 VITALS — BP 120/76 | HR 63 | Temp 98.3°F | Ht 68.0 in | Wt 290.8 lb

## 2023-11-20 DIAGNOSIS — E1142 Type 2 diabetes mellitus with diabetic polyneuropathy: Secondary | ICD-10-CM

## 2023-11-20 DIAGNOSIS — I10 Essential (primary) hypertension: Secondary | ICD-10-CM | POA: Diagnosis not present

## 2023-11-20 DIAGNOSIS — Z780 Asymptomatic menopausal state: Secondary | ICD-10-CM

## 2023-11-20 DIAGNOSIS — Z23 Encounter for immunization: Secondary | ICD-10-CM | POA: Diagnosis not present

## 2023-11-20 DIAGNOSIS — Z7984 Long term (current) use of oral hypoglycemic drugs: Secondary | ICD-10-CM

## 2023-11-20 DIAGNOSIS — Z13820 Encounter for screening for osteoporosis: Secondary | ICD-10-CM | POA: Diagnosis not present

## 2023-11-20 DIAGNOSIS — Z1159 Encounter for screening for other viral diseases: Secondary | ICD-10-CM

## 2023-11-20 NOTE — Progress Notes (Unsigned)
 [facility] [address] [facility] [address] [place] [address] Dept: [phone] Dept Fax: [phone]  Chronic Care Office Visit  Subjective:    Patient ID: Connie Mosley, female    DOB: 12-07-1960, 63 y.o..   MRN: [id number]  Chief Complaint  Patient presents with   Hypertension    3 month f/u HTN.  BP at home 126/80 - 130/80.   No concerns.  Fasting today.    History of Present Illness:  Patient is in today for reassessment of chronic medical issues.  Ms. Freshour has essential hypertension. She is managed on lisinopril-HCTZ (Zestoretic) 20-25 mg daily.    Ms. Platas has a history of right knee osteoarthritis. Orthopedics has recommended she have a knee joint replacement, but wants her to lose 40 lbs before proceeding with surgery. She notes she is making gradual progress in weight loss.   Ms. Bento has Type 2 diabetes. She is managed on metformin 500 mg daily.   Past Medical History: Patient Active Problem List   Diagnosis Date Noted   Medical contraindication to statin therapy 08/16/2023   Type 2 diabetes mellitus with morbid obesity (HCC) 08/07/2023   Vitamin D deficiency - new onset 08/07/2023   BMI 40.0-44.9, adult (HCC) Current BMI 43.9 08/07/2023   Acute cystitis without hematuria 05/16/2023   Type 2 diabetes mellitus with diabetic polyneuropathy, without long-term current use of insulin (HCC) 03/27/2023   Diabetic polyneuropathy (HCC) 02/13/2023   Hammer toes, bilateral 02/13/2023   PVCs (premature ventricular contractions) 01/17/2022   GERD (gastroesophageal reflux disease) 01/17/2022   Unilateral primary osteoarthritis, right knee 02/14/2021   Insulin resistance 01/05/2020   History of non anemic vitamin B12 deficiency 04/22/2018   Morbid obesity (HCC) 06/26/2017   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 6 mL 0   diclofenac (VOLTAREN) 75 MG EC tablet TAKE 1 TABLET BY MOUTH TWICE A DAY 60 tablet 3   folic acid (FOLVITE) 1 MG tablet Take 1 tablet (1 mg total) by mouth daily. 90 tablet 0   lisinopril-hydrochlorothiazide (ZESTORETIC) 20-25 MG tablet Take 1 tablet by mouth daily. 90 tablet 3   metFORMIN (GLUCOPHAGE) 500 MG tablet Take 1 tablet (500 mg total) by mouth daily with breakfast. 90 tablet 3   Syringe/Needle, Disp, (SYRINGE 3CC/20GX1") 20G X 1" 3 ML MISC 1 mL by Does not apply route once a week. 24 each 1   Vitamin D, Ergocalciferol, (DRISDOL) 1.25 MG (50000 UNIT) CAPS capsule Take 1 capsule (50,000 Units total) by mouth every 7 (seven) days. 12 capsule 0   naproxen (NAPROSYN) 500 MG tablet Take 1 tablet (500 mg total) by mouth 2 (two) times daily with a meal. 14 tablet 0   No facility-administered medications prior to visit.   Allergies  Allergen Reactions   Cefuroxime Axetil Itching   Sulfamethoxazole-Trimethoprim Other (See Comments)    Reacts with Bone Marrow per Hemotologist    Cefuroxime Hives  and Rash   Objective:   Today's Vitals   11/20/23 0807  BP: 120/76  Pulse: 63  Temp: 98.3 F (36.8 C)  TempSrc: Temporal  SpO2: 98%  Weight: 290 lb 12.8 oz (131.9 kg)  Height: 5\' 8"  (1.727 m)   Body mass index is 44.22 kg/m.   General: Well developed, well nourished. No acute distress. Psych: Alert and oriented. Normal mood and affect.  Health Maintenance Due  Topic Date Due   HIV Screening  Never done   Hepatitis C  Screening  Never done   Pneumonia Vaccine 27+ Years old (3 of 3 - PPSV23 or PCV20) 12/21/2021   DEXA SCAN  Never done     Assessment & Plan:   Problem List Items Addressed This Visit       Cardiovascular and Mediastinum   Essential hypertension - Primary   Blood pressure is in good control. Continue lisinopril-HCTZ (Zestoretic) 20-25 mg daily.         Endocrine   Type 2 diabetes mellitus with diabetic polyneuropathy, without long-term current use of insulin (HCC)   A1c is at goal. I will recheck this today. Continue metformin 500 mg daily.      Other Visit Diagnoses       Need for pneumococcal 20-valent conjugate vaccination       Relevant Orders   Pneumococcal conjugate vaccine 20-valent     Encounter for hepatitis C screening test for low risk patient       Relevant Orders   HCV Ab w Reflex to Quant PCR     Screening for osteoporosis       Relevant Orders   DG Bone Density     Postmenopausal       Relevant Orders   DG Bone Density       Return in about 3 months (around 02/20/2024) for Reassessment.   Loyola Mast, MD

## 2023-11-20 NOTE — Assessment & Plan Note (Signed)
Blood pressure is in good control. Continue lisinopril-HCTZ (Zestoretic) 20-25 mg daily.

## 2023-11-20 NOTE — Assessment & Plan Note (Addendum)
 A1c is at goal. I will recheck this today. Continue metformin 500 mg daily.

## 2023-11-21 ENCOUNTER — Encounter: Payer: Self-pay | Admitting: Family Medicine

## 2023-11-21 DIAGNOSIS — E1142 Type 2 diabetes mellitus with diabetic polyneuropathy: Secondary | ICD-10-CM

## 2023-11-22 LAB — HCV INTERPRETATION

## 2023-11-22 LAB — HCV AB W REFLEX TO QUANT PCR: HCV Ab: NONREACTIVE

## 2023-12-05 ENCOUNTER — Other Ambulatory Visit: Payer: Self-pay | Admitting: Hematology and Oncology

## 2023-12-05 DIAGNOSIS — D5919 Other autoimmune hemolytic anemia: Secondary | ICD-10-CM

## 2024-02-06 ENCOUNTER — Inpatient Hospital Stay: Payer: Medicare Other | Attending: Hematology and Oncology

## 2024-02-06 ENCOUNTER — Other Ambulatory Visit: Payer: Self-pay | Admitting: Hematology and Oncology

## 2024-02-06 ENCOUNTER — Inpatient Hospital Stay: Payer: Medicare Other | Admitting: Hematology and Oncology

## 2024-02-06 VITALS — BP 141/89 | HR 78 | Temp 97.7°F | Resp 14 | Wt 291.8 lb

## 2024-02-06 DIAGNOSIS — D591 Autoimmune hemolytic anemia, unspecified: Secondary | ICD-10-CM | POA: Insufficient documentation

## 2024-02-06 DIAGNOSIS — E119 Type 2 diabetes mellitus without complications: Secondary | ICD-10-CM | POA: Diagnosis not present

## 2024-02-06 DIAGNOSIS — D5919 Other autoimmune hemolytic anemia: Secondary | ICD-10-CM | POA: Diagnosis not present

## 2024-02-06 DIAGNOSIS — E538 Deficiency of other specified B group vitamins: Secondary | ICD-10-CM | POA: Insufficient documentation

## 2024-02-06 LAB — CMP (CANCER CENTER ONLY)
ALT: 15 U/L (ref 0–44)
AST: 16 U/L (ref 15–41)
Albumin: 4.6 g/dL (ref 3.5–5.0)
Alkaline Phosphatase: 61 U/L (ref 38–126)
Anion gap: 7 (ref 5–15)
BUN: 15 mg/dL (ref 8–23)
CO2: 29 mmol/L (ref 22–32)
Calcium: 10 mg/dL (ref 8.9–10.3)
Chloride: 104 mmol/L (ref 98–111)
Creatinine: 0.88 mg/dL (ref 0.44–1.00)
GFR, Estimated: >60 mL/min (ref >60)
Glucose, Bld: 126 mg/dL — ABNORMAL HIGH (ref 70–99)
Potassium: 4 mmol/L (ref 3.5–5.1)
Sodium: 140 mmol/L (ref 135–145)
Total Bilirubin: 1.1 mg/dL (ref 0.0–1.2)
Total Protein: 7.6 g/dL (ref 6.5–8.1)

## 2024-02-06 LAB — CBC WITH DIFFERENTIAL (CANCER CENTER ONLY)
Abs Immature Granulocytes: 0.02 10*3/uL (ref 0.00–0.07)
Basophils Absolute: 0.2 10*3/uL — ABNORMAL HIGH (ref 0.0–0.1)
Basophils Relative: 2 %
Eosinophils Absolute: 0.3 10*3/uL (ref 0.0–0.5)
Eosinophils Relative: 3 %
HCT: 45.1 % (ref 36.0–46.0)
Hemoglobin: 15 g/dL (ref 12.0–15.0)
Immature Granulocytes: 0 %
Lymphocytes Relative: 52 %
Lymphs Abs: 5.1 10*3/uL — ABNORMAL HIGH (ref 0.7–4.0)
MCH: 29.3 pg (ref 26.0–34.0)
MCHC: 33.3 g/dL (ref 30.0–36.0)
MCV: 88.1 fL (ref 80.0–100.0)
Monocytes Absolute: 0.8 10*3/uL (ref 0.1–1.0)
Monocytes Relative: 8 %
Neutro Abs: 3.4 10*3/uL (ref 1.7–7.7)
Neutrophils Relative %: 35 %
Platelet Count: 370 10*3/uL (ref 150–400)
RBC: 5.12 MIL/uL — ABNORMAL HIGH (ref 3.87–5.11)
RDW: 15 % (ref 11.5–15.5)
Smear Review: NORMAL
WBC Count: 9.8 10*3/uL (ref 4.0–10.5)
nRBC: 0 % (ref 0.0–0.2)

## 2024-02-06 LAB — HEMOGLOBIN A1C
Hgb A1c MFr Bld: 5.7 % — ABNORMAL HIGH (ref 4.8–5.6)
Mean Plasma Glucose: 116.89 mg/dL

## 2024-02-06 LAB — LACTATE DEHYDROGENASE: LDH: 161 U/L (ref 98–192)

## 2024-02-06 LAB — RETIC PANEL
Immature Retic Fract: 8.1 % (ref 2.3–15.9)
RBC.: 5.11 MIL/uL (ref 3.87–5.11)
Retic Count, Absolute: 94.5 10*3/uL (ref 19.0–186.0)
Retic Ct Pct: 1.9 % (ref 0.4–3.1)
Reticulocyte Hemoglobin: 33 pg (ref >27.9)

## 2024-02-06 LAB — VITAMIN B12: Vitamin B-12: 549 pg/mL (ref 180–914)

## 2024-02-06 NOTE — Progress Notes (Signed)
 [facility] [phone]   [phone]  PROGRESS NOTE  Patient Care Team: Graig Lawyer, MD as PCP - General (Family Medicine) Euell Herrlich, MD as Consulting Physician (Cardiology) Ander Bame, MD as Consulting Physician (Hematology and Oncology) Center, [facility]  Hematological/Oncological History # Autoimmune Hemolytic Anemia 08/16/2020: last visit with Dr. Salvadore Creek at [facility] 01/19/2021: establish care with Dr. Rosaline Coma  Interval History:  Connie Mosley 63 y.o. female with medical history significant for autoimmune hemolytic anemia who presents for a follow up visit. The patient's last visit was on 02/02/2023. In the interim since the last visit she has had no new changes in her health.   On exam today Connie Mosley reports she has been well overall in the interim since her last visit 1 year ago.  She continues taking her every 2-week B12 injection and takes folate pills.  She reports her energy levels are good and her appetite is strong.  She does like the idea of losing weight but her weight is currently steady in the 290s.  She reports that she has not had any recent illnesses or viral illnesses but did have 1 COVID infection earlier this year.  She denies any dark urine or jaundice any of her eyes/skin.  She reports she did not take her blood pressure medication today.  Overall she feels well and has no questions concerns or complaints today.  A full 10 point ROS is otherwise negative.  MEDICAL HISTORY:  Past Medical History:  Diagnosis Date   B12 deficiency    Back pain    GERD (gastroesophageal reflux disease)    Hypertension 09/11/2008   IBS (irritable bowel syndrome)    Idiopathic autoimmune hemolytic anemia (HCC) 2010   rx included in past prednisone, retuximab, splenectomy   Insulin  resistance    Irritable bowel syndrome 05/12/2012   Lactose intolerance    Obesity    PVC's (premature ventricular contractions)    SOB  (shortness of breath) on exertion    Type 2 diabetes mellitus (HCC)     SURGICAL HISTORY: Past Surgical History:  Procedure Laterality Date   APPENDECTOMY  1982   SPLENECTOMY  05/2012   TONSILECTOMY/ADENOIDECTOMY WITH MYRINGOTOMY     childhood    TOTAL KNEE ARTHROPLASTY Left 2012    SOCIAL HISTORY: Social History   Socioeconomic History   Marital status: Married    Spouse name: Not on file   Number of children: 3   Years of education: Not on file   Highest education level: Associate degree: academic program  Occupational History   Occupation: Retired   Tobacco Use   Smoking status: Never   Smokeless tobacco: Never  Vaping Use   Vaping status: Never Used  Substance and Sexual Activity   Alcohol use: Yes    Comment: wine 2 x a week   Drug use: Never   Sexual activity: Yes  Other Topics Concern   Not on file  Social History Narrative   Married   From [place], [place]   Former LPN   Went on disability in 2013 for her autoimmune disorder   Moved to [place] in 2018   Social Drivers of Health   Financial Resource Strain: Low Risk  (11/16/2023)   Overall Financial Resource Strain (CARDIA)    Difficulty of Paying Living Expenses: Not hard at all  Food Insecurity: No Food Insecurity (11/16/2023)   Hunger Vital Sign    Worried About Running Out of Food in the  Last Year: Never true    Ran Out of Food in the Last Year: Never true  Transportation Needs: No Transportation Needs (11/16/2023)   PRAPARE - Administrator, Civil Service (Medical): No    Lack of Transportation (Non-Medical): No  Physical Activity: Insufficiently Active (11/16/2023)   Exercise Vital Sign    Days of Exercise per Week: 3 days    Minutes of Exercise per Session: 20 min  Stress: No Stress Concern Present (11/16/2023)   [employer] of Occupational Health - Occupational Stress Questionnaire    Feeling of Stress : Not at all  Social Connections: Socially Integrated (11/16/2023)   Social Connection and  Isolation Panel [NHANES]    Frequency of Communication with Friends and Family: More than three times a week    Frequency of Social Gatherings with Friends and Family: Three times a week    Attends Religious Services: More than 4 times per year    Active Member of Clubs or Organizations: Yes    Attends Banker Meetings: More than 4 times per year    Marital Status: Married  Catering manager Violence: Not At Risk (03/05/2023)   Humiliation, Afraid, Rape, and Kick questionnaire    Fear of Current or Ex-Partner: No    Emotionally Abused: No    Physically Abused: No    Sexually Abused: No    FAMILY HISTORY: Family History  Adopted: Yes  Family history unknown: Yes    ALLERGIES:  is allergic to cefuroxime axetil, sulfamethoxazole-trimethoprim, and cefuroxime.  MEDICATIONS:  Current Outpatient Medications  Medication Sig Dispense Refill   cyanocobalamin  (VITAMIN B12) 1000 MCG/ML injection Inject 1 mL (1,000 mcg total) into the muscle every 14 (fourteen) days. 6 mL 0   diclofenac  (VOLTAREN ) 75 MG EC tablet TAKE 1 TABLET BY MOUTH TWICE A DAY 60 tablet 3   folic acid  (FOLVITE ) 1 MG tablet TAKE 1 TABLET BY MOUTH DAILY 90 tablet 0   lisinopril -hydrochlorothiazide  (ZESTORETIC ) 20-25 MG tablet Take 1 tablet by mouth daily. 90 tablet 3   metFORMIN  (GLUCOPHAGE ) 500 MG tablet Take 1 tablet (500 mg total) by mouth daily with breakfast. 90 tablet 3   Syringe/Needle, Disp, (SYRINGE 3CC/20GX1") 20G X 1" 3 ML MISC 1 mL by Does not apply route once a week. 24 each 1   Vitamin D , Ergocalciferol , (DRISDOL ) 1.25 MG (50000 UNIT) CAPS capsule Take 1 capsule (50,000 Units total) by mouth every 7 (seven) days. 12 capsule 0   No current facility-administered medications for this visit.    REVIEW OF SYSTEMS:   Constitutional: ( - ) fevers, ( - )  chills , ( - ) night sweats Eyes: ( - ) blurriness of vision, ( - ) double vision, ( - ) watery eyes Ears, nose, mouth, throat, and face: ( - )  mucositis, ( - ) sore throat Respiratory: ( - ) cough, ( - ) dyspnea, ( - ) wheezes Cardiovascular: ( - ) palpitation, ( - ) chest discomfort, ( - ) lower extremity swelling Gastrointestinal:  ( - ) nausea, ( - ) heartburn, ( - ) change in bowel habits Skin: ( - ) abnormal skin rashes Lymphatics: ( - ) new lymphadenopathy, ( - ) easy bruising Neurological: ( - ) numbness, ( - ) tingling, ( - ) new weaknesses Behavioral/Psych: ( - ) mood change, ( - ) new changes  All other systems were reviewed with the patient and are negative.  PHYSICAL EXAMINATION:  Vitals:   02/06/24 0900 02/06/24  0904  BP: (!) 142/83 (!) 141/89  Pulse: 78   Resp: 14   Temp: 97.7 F (36.5 C)   SpO2: 99%      Filed Weights   02/06/24 0900  Weight: 291 lb 12.8 oz (132.4 kg)      GENERAL: Well-appearing middle-age Caucasian female, alert, no distress and comfortable SKIN: skin color, texture, turgor are normal, no rashes or significant lesions EYES: conjunctiva are pink and non-injected, sclera clear LUNGS: clear to auscultation and percussion with normal breathing effort HEART: regular rate & rhythm and no murmurs and no lower extremity edema Musculoskeletal: no cyanosis of digits and no clubbing  PSYCH: alert & oriented x 3, fluent speech NEURO: no focal motor/sensory deficits  LABORATORY DATA:  I have reviewed the data as listed    Latest Ref Rng & Units 02/06/2024    8:25 AM 05/04/2023   11:50 AM 02/02/2023    9:36 AM  CBC  WBC 4.0 - 10.5 K/uL 9.8  10.9  9.2   Hemoglobin 12.0 - 15.0 g/dL 82.9  56.2  13.0   Hematocrit 36.0 - 46.0 % 45.1  42.1  44.5   Platelets 150 - 400 K/uL 370  320  310        Latest Ref Rng & Units 02/06/2024    8:25 AM 08/16/2023    8:45 AM 05/04/2023   11:50 AM  CMP  Glucose 70 - 99 mg/dL 865  784  696   BUN 8 - 23 mg/dL 15   13   Creatinine 2.95 - 1.00 mg/dL 2.84   1.32   Sodium 440 - 145 mmol/L 140   137   Potassium 3.5 - 5.1 mmol/L 4.0   4.0   Chloride 98 - 111  mmol/L 104   104   CO2 22 - 32 mmol/L 29   28   Calcium 8.9 - 10.3 mg/dL 10.2   9.6   Total Protein 6.5 - 8.1 g/dL 7.6   6.9   Total Bilirubin 0.0 - 1.2 mg/dL 1.1   0.7   Alkaline Phos 38 - 126 U/L 61   58   AST 15 - 41 U/L 16   22   ALT 0 - 44 U/L 15   21     No results found for: "MPROTEIN" No results found for: "KPAFRELGTCHN", "LAMBDASER", "KAPLAMBRATIO"   RADIOGRAPHIC STUDIES: No results found.  ASSESSMENT & PLAN Connie Mosley 63 y.o. female with medical history significant for autoimmune hemolytic anemia who presents for a follow up visit.   After review of the labs, review of the records, and discussion with the patient, the findings are most consistent with a well-controlled autoimmune hemolytic anemia.  She has been stable since 2017.  She was last seen at [facility] in December 2021 at which time her history was summarized as having undergone rituximab, cyclophosphamide, splenectomy, and finally a long steroid taper which put her into a durable remission in 2017.  She presents today because she would like to have a local hematologist as a go to in the event there were further issues.   Previously I discussed with her that given her stable hemoglobin I be happy to see her once a year for check at and on an as-needed basis if she were to develop any new or worsening symptoms.  She voiced understanding of this plan moving forward and noted she would be vigilant for the symptoms that would imply she is having an episode of  hemolytic anemia.   # Autoimmune Hemolytic Anemia --today will order new baseline labs to include CBC, CMP, LDH, haptoglobin -- Based on prior labs and records the patient has had stable hemoglobin since 2017. --No indication for treatment at this time, we will continue with observation. --Labs today show white blood cell count 9.8, hemoglobin 15.0, MCV 88.1, platelets 370 --Strict return precautions for patient if she were to notice  jaundice, darkening of her urine, fatigue, or other concerning signs or symptoms. --RTC in 1 years time or sooner if indicated by labs/symptoms.   #Vitamin B12 Deficiency #Folic Acid  supplementation -- Patient is currently on vitamin B12 and folic acid  for her hemolytic anemia. Continue folic acid  1 mg p.o. daily as well as her vitamin B12 1000 mcg subcu every 2 weeks.  Orders Placed This Encounter  Procedures   Vitamin B12    Standing Status:   Future    Number of Occurrences:   1    Expiration Date:   02/05/2025   Hemoglobin A1c    Standing Status:   Future    Number of Occurrences:   1    Expected Date:   02/06/2024    Expiration Date:   02/05/2025    All questions were answered. The patient knows to call the clinic with any problems, questions or concerns.  A total of more than 25 minutes were spent on this encounter with face-to-face time and non-face-to-face time, including preparing to see the patient, ordering tests and/or medications, counseling the patient and coordination of care as outlined above.   Rogerio Clay, MD Department of Hematology/Oncology [facility] at [facility] Phone: [phone] Pager: [phone] Email: [email or web address]  02/06/2024 9:40 AM

## 2024-02-07 LAB — HAPTOGLOBIN: Haptoglobin: 56 mg/dL (ref 37–355)

## 2024-02-21 ENCOUNTER — Ambulatory Visit: Admitting: Family Medicine

## 2024-02-25 ENCOUNTER — Other Ambulatory Visit: Payer: Self-pay | Admitting: Hematology and Oncology

## 2024-02-25 DIAGNOSIS — D5919 Other autoimmune hemolytic anemia: Secondary | ICD-10-CM

## 2024-03-06 ENCOUNTER — Ambulatory Visit: Admitting: Family Medicine

## 2024-03-06 VITALS — BP 136/78 | HR 94 | Temp 97.0°F | Ht 68.0 in | Wt 294.0 lb

## 2024-03-06 DIAGNOSIS — J0110 Acute frontal sinusitis, unspecified: Secondary | ICD-10-CM

## 2024-03-06 DIAGNOSIS — Z9081 Acquired absence of spleen: Secondary | ICD-10-CM

## 2024-03-06 DIAGNOSIS — Z5309 Procedure and treatment not carried out because of other contraindication: Secondary | ICD-10-CM

## 2024-03-06 DIAGNOSIS — E1142 Type 2 diabetes mellitus with diabetic polyneuropathy: Secondary | ICD-10-CM

## 2024-03-06 DIAGNOSIS — I10 Essential (primary) hypertension: Secondary | ICD-10-CM

## 2024-03-06 DIAGNOSIS — Z23 Encounter for immunization: Secondary | ICD-10-CM

## 2024-03-06 DIAGNOSIS — Z7984 Long term (current) use of oral hypoglycemic drugs: Secondary | ICD-10-CM

## 2024-03-06 LAB — URINALYSIS, ROUTINE W REFLEX MICROSCOPIC
Bilirubin Urine: NEGATIVE
Hgb urine dipstick: NEGATIVE
Ketones, ur: NEGATIVE
Leukocytes,Ua: NEGATIVE
Nitrite: NEGATIVE
RBC / HPF: NONE SEEN (ref 0–?)
Specific Gravity, Urine: <=1.005 — AB (ref 1.000–1.030)
Total Protein, Urine: NEGATIVE
Urine Glucose: NEGATIVE
Urobilinogen, UA: 0.2 (ref 0.0–1.0)
pH: 6 (ref 5.0–8.0)

## 2024-03-06 LAB — LIPID PANEL
Cholesterol: 124 mg/dL (ref 0–200)
HDL: 32.4 mg/dL — ABNORMAL LOW (ref >39.00)
LDL Cholesterol: 66 mg/dL (ref 0–99)
NonHDL: 91.77
Total CHOL/HDL Ratio: 4
Triglycerides: 127 mg/dL (ref 0.0–149.0)
VLDL: 25.4 mg/dL (ref 0.0–40.0)

## 2024-03-06 LAB — MICROALBUMIN / CREATININE URINE RATIO
Creatinine,U: 63.6 mg/dL
Microalb Creat Ratio: 25.5 mg/g (ref 0.0–30.0)
Microalb, Ur: 1.6 mg/dL (ref 0.0–1.9)

## 2024-03-06 MED ORDER — DOXYCYCLINE HYCLATE 100 MG PO TABS: 100.0000 mg | ORAL_TABLET | Freq: Two times a day (BID) | ORAL | 0 refills | Status: DC

## 2024-03-06 NOTE — Assessment & Plan Note (Signed)
 Continue focus on blood sugar control. Prior surgery for hammer toes on the right. Patient does not desire to seek surgery for the same issue on the left.

## 2024-03-06 NOTE — Assessment & Plan Note (Signed)
 I recommend repeat meningococcal vaccination.

## 2024-03-06 NOTE — Assessment & Plan Note (Signed)
 A1c is at goal. I will check her annual micral today. Continue metformin  500 mg daily.

## 2024-03-06 NOTE — Progress Notes (Signed)
 [facility] [address] [place] [address] Dept: [phone] Dept Fax: [phone]  Chronic Care Office Visit  Subjective:    Patient ID: Connie Mosley, female    DOB: Apr 27, 1961, 63 y.o..   MRN: [id number]  Chief Complaint  Patient presents with   Hypertension    3 month f/u HTN/DM.  Fasting today.  C/o having sinus congestion x 9 days. Has taken Robitussin. /   History of Present Illness:  Patient is in today for reassessment of chronic medical issues.  Ms. Tiley has essential hypertension. She is managed on lisinopril -HCTZ (Zestoretic ) 20-25 mg daily.    Ms. Donahoe has Type 2 diabetes. She is managed on metformin  500 mg daily.   Ms. Lutze has a history of idiopathic autoimmune hemolytic anemia. She has had a prior splenectomy. She recently was seen by hematology and she is stable and doing well.  Ms. Nebergall notes that she has been having some nasal congestion, sinus pressure and cough for about 9 days. She continues to cough up mucous.  Past Medical History: Patient Active Problem List   Diagnosis Date Noted   Medical contraindication to statin therapy 08/16/2023   Type 2 diabetes mellitus with morbid obesity (HCC) 08/07/2023   Vitamin D  deficiency 08/07/2023   BMI 40.0-44.9, adult (HCC) Current BMI 43.9 08/07/2023   Acute cystitis without hematuria 05/16/2023   Type 2 diabetes mellitus with diabetic polyneuropathy, without long-term current use of insulin  (HCC) 03/27/2023   Diabetic polyneuropathy (HCC) 02/13/2023   Hammer toes, bilateral 02/13/2023   PVCs (premature ventricular contractions) 01/17/2022   GERD (gastroesophageal reflux disease) 01/17/2022   Unilateral primary osteoarthritis, right knee 02/14/2021   Insulin  resistance 01/05/2020   History of non anemic vitamin B12 deficiency 04/22/2018   Morbid obesity (HCC) 06/26/2017   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 6 mL 0   diclofenac  (VOLTAREN ) 75 MG EC tablet TAKE 1 TABLET BY MOUTH TWICE A DAY 60 tablet 3   folic acid  (FOLVITE ) 1 MG tablet TAKE 1 TABLET BY MOUTH DAILY 90 tablet 0   lisinopril -hydrochlorothiazide  (ZESTORETIC ) 20-25 MG tablet Take 1 tablet by mouth daily. 90 tablet 3   metFORMIN  (GLUCOPHAGE ) 500 MG tablet Take 1 tablet (500 mg total) by mouth daily with breakfast. 90 tablet 3   Syringe/Needle, Disp, (SYRINGE 3CC/20GX1) 20G X 1 3 ML MISC 1 mL by Does not apply route once a week. 24 each 1   Vitamin D , Ergocalciferol , (DRISDOL ) 1.25 MG (50000 UNIT) CAPS capsule Take 1 capsule (50,000 Units total) by mouth every 7 (seven) days. 12 capsule 0   No facility-administered medications prior to visit.   Allergies  Allergen Reactions   Cefuroxime Axetil Itching   Sulfamethoxazole-Trimethoprim Other (See Comments)    Reacts with Bone Marrow per Hemotologist    Cefuroxime Hives and Rash   Objective:   Today's Vitals   03/06/24 0804  BP: 136/78  Pulse: 94  Temp: (!) 97 F (36.1 C)  TempSrc: Temporal  SpO2: 96%  Weight: 294 lb (133.4 kg)  Height: 5' 8 (1.727 m)   Body mass index is 44.7 kg/m.   General: Well developed, well nourished. No acute distress. HEENT: Normocephalic, non-traumatic. PERRL, EOMI. Conjunctiva clear. Nose with moderate congestion and   rhinorrhea. Pain on percussion over frontal sinuses. Mucous membranes moist.  Oropharynx clear. Good   dentition. Lungs: Clear to auscultation bilaterally. No wheezing, rales or rhonchi. Feet- Skin intact. No sign of maceration between toes. Nails are normal. There are hammer toe deformities on   the left. Dorsalis pedis and posterior tibial artery pulses are normal. 5.07 monofilament testing shows some   limited areas of numbness bilat. Psych: Alert and oriented. Normal mood and affect.  Health Maintenance Due  Topic Date Due   HIV Screening  Never done   DEXA SCAN  Never done   Cervical Cancer Screening (HPV/Pap Cotest)  01/28/2024   Diabetic kidney evaluation - Urine ACR  02/13/2024   Medicare Annual Wellness (AWV)  03/04/2024     Lab Results:    Latest Ref Rng & Units 02/06/2024    8:25 AM 05/04/2023   11:50 AM 02/02/2023    9:36 AM  CBC  WBC 4.0 - 10.5 K/uL 9.8  10.9  9.2   Hemoglobin 12.0 - 15.0 g/dL 84.9  85.7  85.1   Hematocrit 36.0 - 46.0 % 45.1  42.1  44.5   Platelets 150 - 400 K/uL 370  320  310       Latest Ref Rng & Units 02/06/2024    8:25 AM 08/16/2023    8:45 AM 05/04/2023   11:50 AM  CMP  Glucose 70 - 99 mg/dL 873  877  887   BUN 8 - 23 mg/dL 15   13   Creatinine 9.55 - 1.00 mg/dL 9.11   9.21   Sodium 864 - 145 mmol/L 140   137   Potassium 3.5 - 5.1 mmol/L 4.0   4.0   Chloride 98 - 111 mmol/L 104   104   CO2 22 - 32 mmol/L 29   28   Calcium 8.9 - 10.3 mg/dL 89.9   9.6   Total Protein 6.5 - 8.1 g/dL 7.6   6.9   Total Bilirubin 0.0 - 1.2 mg/dL 1.1   0.7   Alkaline Phos 38 - 126 U/L 61   58   AST 15 - 41 U/L 16   22   ALT 0 - 44 U/L 15   21    Lab Results  Component Value Date   HGBA1C 5.7 (H) 02/06/2024   HGBA1C 6.3 08/16/2023   HGBA1C 6.4 (H) 05/09/2023   Assessment & Plan:   Problem List Items Addressed This Visit       Cardiovascular and Mediastinum   Essential hypertension - Primary   Blood pressure is elevated today, but had been in good control. There have been some recent family stressors that may be impacting this.  Continue lisinopril -HCTZ (Zestoretic ) 20-25 mg daily. We will watch this for now.         Endocrine   Diabetic polyneuropathy (HCC)   Continue focus on blood sugar control. Prior surgery for hammer toes on the right. Patient does not desire to seek surgery for the same issue on the left.      Type 2 diabetes mellitus with diabetic polyneuropathy, without long-term current use of insulin  (HCC)   A1c  is at goal. I will check her annual micral today. Continue metformin  500 mg daily.      Relevant Orders   Lipid panel   Microalbumin / creatinine urine ratio   Urinalysis, Routine w reflex microscopic     Other   Medical contraindication to statin therapy   S/P splenectomy   I recommend repeat meningococcal vaccination.      Relevant Orders   MENINGOCOCCAL MCV4O (Completed)   Meningococcal B, OMV (Completed)   Other Visit Diagnoses       Acute non-recurrent frontal sinusitis       I will prescribe doxycycline for 10 days.   Relevant Medications   doxycycline (VIBRA-TABS) 100 MG tablet     Need for meningococcal vaccination       Relevant Orders   MENINGOCOCCAL MCV4O (Completed)   Meningococcal B, OMV (Completed)       Return in about 3 months (around 06/06/2024) for Reassessment.   Garnette CHRISTELLA Simpler, MD

## 2024-03-06 NOTE — Assessment & Plan Note (Signed)
 Blood pressure is elevated today, but had been in good control. There have been some recent family stressors that may be impacting this. Continue lisinopril -HCTZ (Zestoretic ) 20-25 mg daily. We will watch this for now.

## 2024-03-07 ENCOUNTER — Ambulatory Visit: Payer: Self-pay | Admitting: Family Medicine

## 2024-03-10 ENCOUNTER — Ambulatory Visit (INDEPENDENT_AMBULATORY_CARE_PROVIDER_SITE_OTHER): Payer: Medicare Other

## 2024-03-10 DIAGNOSIS — Z0000 Encounter for general adult medical examination without abnormal findings: Secondary | ICD-10-CM | POA: Diagnosis not present

## 2024-03-10 NOTE — Patient Instructions (Signed)
 Connie Mosley , Thank you for taking time out of your busy schedule to complete your Annual Wellness Visit with me. I enjoyed our conversation and look forward to speaking with you again next year. I, as well as your care team,  appreciate your ongoing commitment to your health goals. Please review the following plan we discussed and let me know if I can assist you in the future. Your Game plan/ To Do List    Referrals: If you haven't heard from the office you've been referred to, please reach out to them at the phone provided.  N/a Follow up Visits: Next Medicare AWV with our clinical staff: 03/13/2025 at 8:10   Have you seen your provider in the last 6 months (3 months if uncontrolled diabetes)? Yes Next Office Visit with your provider: will call to schedule 3 month follow up  Clinician Recommendations:  Aim for 30 minutes of exercise or brisk walking, 6-8 glasses of water, and 5 servings of fruits and vegetables each day.       This is a list of the screening recommended for you and due dates:  Health Maintenance  Topic Date Due   HIV Screening  Never done   DEXA scan (bone density measurement)  Never done   Pap with HPV screening  01/28/2024   COVID-19 Vaccine (4 - 2024-25 season) 03/22/2024*   Flu Shot  04/11/2024   Mammogram  05/14/2024   Hemoglobin A1C  08/08/2024   Eye exam for diabetics  08/15/2024   Yearly kidney function blood test for diabetes  02/05/2025   Yearly kidney health urinalysis for diabetes  03/06/2025   Complete foot exam   03/06/2025   Medicare Annual Wellness Visit  03/10/2025   DTaP/Tdap/Td vaccine (3 - Td or Tdap) 02/28/2026   Colon Cancer Screening  08/11/2028   Pneumococcal Vaccine for age over 38  Completed   Hepatitis C Screening  Completed   Zoster (Shingles) Vaccine  Completed   Hepatitis B Vaccine  Aged Out   HPV Vaccine  Aged Out   Meningitis B Vaccine  Aged Out  *Topic was postponed. The date shown is not the original due date.    Advanced  directives: (ACP Link)Information on Advanced Care Planning can be found at [place]  Secretary of [facility] Advance Health Care Directives Advance Health Care Directives. [email or web address]  Advance Care Planning is important because it:  [x]  Makes sure you receive the medical care that is consistent with your values, goals, and preferences  [x]  It provides guidance to your family and loved ones and reduces their decisional burden about whether or not they are making the right decisions based on your wishes.  Follow the link provided in your after visit summary or read over the paperwork we have mailed to you to help you started getting your Advance Directives in place. If you need assistance in completing these, please reach out to us  so that we can help you!  See attachments for Preventive Care and Fall Prevention Tips.

## 2024-03-10 NOTE — Progress Notes (Signed)
 Subjective:   Connie Mosley is a 63 y.o. who presents for a Medicare Wellness preventive visit.  As a reminder, Annual Wellness Visits don't include a physical exam, and some assessments may be limited, especially if this visit is performed virtually. We may recommend an in-person follow-up visit with your provider if needed.  Visit Complete: Virtual I connected with  Connie Mosley on 03/10/24 by a audio enabled telemedicine application and verified that I am speaking with the correct person using two identifiers.  Patient Location: Home  Provider Location: Office/Clinic  I discussed the limitations of evaluation and management by telemedicine. The patient expressed understanding and agreed to proceed.  Vital Signs: Because this visit was a virtual/telehealth visit, some criteria may be missing or patient reported. Any vitals not documented were not able to be obtained and vitals that have been documented are patient reported.  VideoError- Librarian, academic were attempted between this provider and patient, however failed, due to patient having technical difficulties OR patient did not have access to video capability.  We continued and completed visit with audio only.   Persons Participating in Visit: Patient.  AWV Questionnaire: Yes: Patient Medicare AWV questionnaire was completed by the patient on 03/06/2024; I have confirmed that all information answered by patient is correct and no changes since this date.  Cardiac Risk Factors include: advanced age (>63men, >60 women);diabetes mellitus;hypertension     Objective:    Today's Vitals   There is no height or weight on file to calculate BMI.     03/10/2024    8:12 AM 03/05/2023    2:11 PM 03/01/2022    9:22 AM 02/03/2021    1:50 PM 01/05/2020    9:22 AM  Advanced Directives  Does Patient Have a Medical Advance Directive? No No No Yes No  Type of Advance Directive    Healthcare Power of Attorney    Would patient like information on creating a medical advance directive? No - Patient declined  No - Patient declined  Yes (MAU/Ambulatory/Procedural Areas - Information given)    Current Medications (verified) Outpatient Encounter Medications as of 03/10/2024  Medication Sig   cyanocobalamin  (VITAMIN B12) 1000 MCG/ML injection Inject 1 mL (1,000 mcg total) into the muscle every 14 (fourteen) days.   diclofenac  (VOLTAREN ) 75 MG EC tablet TAKE 1 TABLET BY MOUTH TWICE A DAY   doxycycline  (VIBRA -TABS) 100 MG tablet Take 1 tablet (100 mg total) by mouth 2 (two) times daily.   folic acid  (FOLVITE ) 1 MG tablet TAKE 1 TABLET BY MOUTH DAILY   lisinopril -hydrochlorothiazide  (ZESTORETIC ) 20-25 MG tablet Take 1 tablet by mouth daily.   metFORMIN  (GLUCOPHAGE ) 500 MG tablet Take 1 tablet (500 mg total) by mouth daily with breakfast.   Syringe/Needle, Disp, (SYRINGE 3CC/20GX1) 20G X 1 3 ML MISC 1 mL by Does not apply route once a week.   Vitamin D , Ergocalciferol , (DRISDOL ) 1.25 MG (50000 UNIT) CAPS capsule Take 1 capsule (50,000 Units total) by mouth every 7 (seven) days.   No facility-administered encounter medications on file as of 03/10/2024.    Allergies (verified) Cefuroxime axetil, Sulfamethoxazole-trimethoprim, and Cefuroxime   History: Past Medical History:  Diagnosis Date   Allergy 09/11/2008   Arthritis [id number]   B12 deficiency    Back pain    Blood transfusion without reported diagnosis 09/11/2008   Cataract 09/12/2003   Clotting disorder (HCC) 2010   Hemolytic Anemia   GERD (gastroesophageal reflux disease)    Heart murmur 09/11/1998  Hypertension 09/11/2008   IBS (irritable bowel syndrome)    Idiopathic autoimmune hemolytic anemia (HCC) 2010   rx included in past prednisone, retuximab, splenectomy   Insulin  resistance    Irritable bowel syndrome 05/12/2012   Lactose intolerance    Obesity    PVC's (premature ventricular contractions)    SOB (shortness of breath) on  exertion    Type 2 diabetes mellitus (HCC)    Past Surgical History:  Procedure Laterality Date   APPENDECTOMY  1982   JOINT REPLACEMENT  01/18/2010   l knee replacemnet   SPLENECTOMY  05/2012   TONSILECTOMY/ADENOIDECTOMY WITH MYRINGOTOMY     childhood    TOTAL KNEE ARTHROPLASTY Left 2012   Family History  Adopted: Yes  Family history unknown: Yes   Social History   Socioeconomic History   Marital status: Married    Spouse name: Not on file   Number of children: 3   Years of education: Not on file   Highest education level: Associate degree: academic program  Occupational History   Occupation: Retired   Tobacco Use   Smoking status: Never   Smokeless tobacco: Never  Vaping Use   Vaping status: Never Used  Substance and Sexual Activity   Alcohol use: Yes    Comment: wine 2 x a week   Drug use: Never   Sexual activity: Yes  Other Topics Concern   Not on file  Social History Narrative   Married   From [place]   Former LPN   Went on disability in 2013 for her autoimmune disorder   Moved to [place] in 2018   Social Drivers of Health   Financial Resource Strain: Low Risk  (03/10/2024)   Overall Financial Resource Strain (CARDIA)    Difficulty of Paying Living Expenses: Not hard at all  Food Insecurity: No Food Insecurity (03/10/2024)   Hunger Vital Sign    Worried About Running Out of Food in the Last Year: Never true    Ran Out of Food in the Last Year: Never true  Transportation Needs: No Transportation Needs (03/10/2024)   PRAPARE - Administrator, Civil Service (Medical): No    Lack of Transportation (Non-Medical): No  Physical Activity: Insufficiently Active (03/10/2024)   Exercise Vital Sign    Days of Exercise per Week: 3 days    Minutes of Exercise per Session: 20 min  Stress: No Stress Concern Present (03/10/2024)   [employer] of Occupational Health - Occupational Stress Questionnaire    Feeling of Stress: Only a little  Social  Connections: Socially Integrated (03/10/2024)   Social Connection and Isolation Panel    Frequency of Communication with Friends and Family: More than three times a week    Frequency of Social Gatherings with Friends and Family: More than three times a week    Attends Religious Services: More than 4 times per year    Active Member of [employer] or Organizations: Not on file    Attends Engineer, structural: More than 4 times per year    Marital Status: Married    Tobacco Counseling Counseling given: Not Answered    Clinical Intake:  Pre-visit preparation completed: Yes  Pain : No/denies pain     Nutritional Risks: None Diabetes: Yes CBG done?: No Did pt. bring in CBG monitor from home?: No  Lab Results  Component Value Date   HGBA1C 5.7 (H) 02/06/2024   HGBA1C 6.3 08/16/2023   HGBA1C 6.4 (H) 05/09/2023  How often do you need to have someone help you when you read instructions, pamphlets, or other written materials from your doctor or pharmacy?: 1 - Never  Interpreter Needed?: No  Information entered by :: NAllen LPN   Activities of Daily Living     03/06/2024    9:18 AM  In your present state of health, do you have any difficulty performing the following activities:  Hearing? 0  Vision? 0  Difficulty concentrating or making decisions? 0  Walking or climbing stairs? 1  Comment needs a knee replacement  Dressing or bathing? 0  Doing errands, shopping? 0  Preparing Food and eating ? N  Using the Toilet? N  In the past six months, have you accidently leaked urine? Y  Do you have problems with loss of bowel control? N  Managing your Medications? N  Managing your Finances? N  Housekeeping or managing your Housekeeping? N    Patient Care Team: Thedora Garnette HERO, MD as PCP - General (Family Medicine) Loni Soyla LABOR, MD as Consulting Physician (Cardiology) Federico Norleen ONEIDA MADISON, MD as Consulting Physician (Hematology and Oncology) [facility] [place]  I  have updated your Care Teams any recent Medical Services you may have received from other providers in the past year.     Assessment:   This is a routine wellness examination for Connie Mosley.  Hearing/Vision screen Hearing Screening - Comments:: Denies hearing issues Vision Screening - Comments:: Regular eye exams,    Goals Addressed             This Visit's Progress    Patient Stated       03/10/2024, wants to lose weight       Depression Screen     03/10/2024    8:13 AM 04/24/2023    9:45 AM 03/05/2023    2:12 PM 02/13/2023    7:57 AM 03/01/2022    9:22 AM 03/01/2022    9:20 AM 01/17/2022    8:14 AM  PHQ 2/9 Scores  PHQ - 2 Score 0 0 0 0 0 0 0  PHQ- 9 Score 0          Fall Risk     03/06/2024    9:18 AM 04/24/2023    9:45 AM 03/01/2023    9:34 AM 02/13/2023    7:56 AM 02/07/2023   11:31 AM  Fall Risk   Falls in the past year? 0 0 0 0 0  Number falls in past yr: 0 0 0 0 0  Injury with Fall? 0 0 0 0   Risk for fall due to : Medication side effect No Fall Risks Medication side effect No Fall Risks No Fall Risks  Follow up Falls prevention discussed;Falls evaluation completed Falls prevention discussed Falls prevention discussed;Education provided;Falls evaluation completed Falls evaluation completed Falls evaluation completed    MEDICARE RISK AT HOME:  Medicare Risk at Home Any stairs in or around the home?: (Patient-Rptd) No Home free of loose throw rugs in walkways, pet beds, electrical cords, etc?: (Patient-Rptd) Yes Adequate lighting in your home to reduce risk of falls?: (Patient-Rptd) Yes Life alert?: (Patient-Rptd) No Use of a cane, walker or w/c?: (Patient-Rptd) No Grab bars in the bathroom?: (Patient-Rptd) Yes Shower chair or bench in shower?: (Patient-Rptd) Yes Elevated toilet seat or a handicapped toilet?: (Patient-Rptd) No  TIMED UP AND GO:  Was the test performed?  No  Cognitive Function: 6CIT completed        03/10/2024    8:14 AM  03/05/2023    2:12 PM  02/03/2021    1:55 PM 01/05/2020    9:23 AM  6CIT Screen  What Year? 0 points 0 points 0 points 0 points  What month? 0 points 0 points 0 points 0 points  What time? 0 points 0 points 0 points 0 points  Count back from 20 0 points 0 points 0 points 0 points  Months in reverse 0 points 0 points 0 points 0 points  Repeat phrase 0 points 2 points 0 points 0 points  Total Score 0 points 2 points 0 points 0 points    Immunizations Immunization History  Administered Date(s) Administered   Influenza Split 07/30/2015   Influenza, Seasonal, Injecte, Preservative Fre 05/16/2023   Influenza,inj,Quad PF,6+ Mos 06/12/2016, 06/25/2017, 06/25/2018, 06/17/2019, 07/28/2021, 05/26/2022   Meningococcal B Recombinant 10/17/2016   Meningococcal B, OMV 10/17/2016, 12/22/2016, 03/06/2024   Meningococcal Conjugate 10/17/2016, 12/22/2016   Meningococcal Mcv4o 03/06/2024   Moderna Sars-Covid-2 Vaccination 11/28/2019, 12/27/2019, 07/17/2020   PNEUMOCOCCAL CONJUGATE-20 11/20/2023   Pneumococcal Conjugate-13 10/17/2016   Pneumococcal Polysaccharide-23 12/21/2016   Td 08/27/2006   Tdap 02/29/2016   Zoster Recombinant(Shingrix) 01/28/2021, 01/17/2022    Screening Tests Health Maintenance  Topic Date Due   HIV Screening  Never done   DEXA SCAN  Never done   Cervical Cancer Screening (HPV/Pap Cotest)  01/28/2024   COVID-19 Vaccine (4 - 2024-25 season) 03/22/2024 (Originally 05/13/2023)   INFLUENZA VACCINE  04/11/2024   MAMMOGRAM  05/14/2024   HEMOGLOBIN A1C  08/08/2024   OPHTHALMOLOGY EXAM  08/15/2024   Diabetic kidney evaluation - eGFR measurement  02/05/2025   Diabetic kidney evaluation - Urine ACR  03/06/2025   FOOT EXAM  03/06/2025   Medicare Annual Wellness (AWV)  03/10/2025   DTaP/Tdap/Td (3 - Td or Tdap) 02/28/2026   Colonoscopy  08/11/2028   Pneumococcal Vaccine: 50+ Years  Completed   Hepatitis C Screening  Completed   Zoster Vaccines- Shingrix  Completed   Hepatitis B Vaccines  Aged Out    HPV VACCINES  Aged Out   Meningococcal B Vaccine  Aged Out    Health Maintenance  Health Maintenance Due  Topic Date Due   HIV Screening  Never done   DEXA SCAN  Never done   Cervical Cancer Screening (HPV/Pap Cotest)  01/28/2024   Health Maintenance Items Addressed: Will follow up on Pap. HIV screening due.  Additional Screening:  Vision Screening: Recommended annual ophthalmology exams for early detection of glaucoma and other disorders of the eye. Would you like a referral to an eye doctor? No    Dental Screening: Recommended annual dental exams for proper oral hygiene  Community Resource Referral / Chronic Care Management: CRR required this visit?  No   CCM required this visit?  No   Plan:    I have personally reviewed and noted the following in the patient's chart:   Medical and social history Use of alcohol, tobacco or illicit drugs  Current medications and supplements including opioid prescriptions. Patient is not currently taking opioid prescriptions. Functional ability and status Nutritional status Physical activity Advanced directives List of other physicians Hospitalizations, surgeries, and ER visits in previous 12 months Vitals Screenings to include cognitive, depression, and falls Referrals and appointments  In addition, I have reviewed and discussed with patient certain preventive protocols, quality metrics, and best practice recommendations. A written personalized care plan for preventive services as well as general preventive health recommendations were provided to patient.   Ardella FORBES Dawn, LPN  03/10/2024   After Visit Summary: (MyChart) Due to this being a telephonic visit, the after visit summary with patients personalized plan was offered to patient via MyChart   Notes: Nothing significant to report at this time.

## 2024-04-11 ENCOUNTER — Encounter: Payer: Self-pay | Admitting: Family Medicine

## 2024-04-11 MED ORDER — FAMOTIDINE 40 MG PO TABS: 40.0000 mg | ORAL_TABLET | Freq: Every day | ORAL | 1 refills | Status: DC

## 2024-04-20 ENCOUNTER — Encounter: Payer: Self-pay | Admitting: Family Medicine

## 2024-04-20 DIAGNOSIS — M1711 Unilateral primary osteoarthritis, right knee: Secondary | ICD-10-CM

## 2024-04-21 ENCOUNTER — Other Ambulatory Visit: Payer: Self-pay | Admitting: Family Medicine

## 2024-04-21 DIAGNOSIS — Z1231 Encounter for screening mammogram for malignant neoplasm of breast: Secondary | ICD-10-CM

## 2024-04-21 MED ORDER — DICLOFENAC SODIUM 75 MG PO TBEC: 75.0000 mg | DELAYED_RELEASE_TABLET | Freq: Two times a day (BID) | ORAL | 3 refills | Status: DC

## 2024-04-22 ENCOUNTER — Other Ambulatory Visit: Payer: Self-pay | Admitting: Hematology and Oncology

## 2024-04-22 DIAGNOSIS — D5919 Other autoimmune hemolytic anemia: Secondary | ICD-10-CM

## 2024-05-09 ENCOUNTER — Other Ambulatory Visit: Payer: Self-pay | Admitting: Hematology and Oncology

## 2024-05-09 DIAGNOSIS — D5919 Other autoimmune hemolytic anemia: Secondary | ICD-10-CM

## 2024-05-16 ENCOUNTER — Ambulatory Visit
Admission: RE | Admit: 2024-05-16 | Discharge: 2024-05-16 | Disposition: A | Discharge status: Home / Self Care | Source: Ambulatory Visit | Attending: Family Medicine | Admitting: Family Medicine

## 2024-05-16 DIAGNOSIS — Z1231 Encounter for screening mammogram for malignant neoplasm of breast: Secondary | ICD-10-CM

## 2024-06-11 DIAGNOSIS — E119 Type 2 diabetes mellitus without complications: Secondary | ICD-10-CM | POA: Diagnosis not present

## 2024-06-11 NOTE — Progress Notes (Signed)
 Connie Mosley                                          MRN: [id number]   06/11/2024   The VBCI Quality Team Specialist reviewed this patient medical record for the purposes of chart review for care gap closure. The following were reviewed: abstraction for care gap closure-kidney health evaluation for diabetes:eGFR  and uACR.    VBCI Quality Team

## 2024-07-07 ENCOUNTER — Ambulatory Visit: Admitting: Family Medicine

## 2024-07-12 DIAGNOSIS — E119 Type 2 diabetes mellitus without complications: Secondary | ICD-10-CM | POA: Diagnosis not present

## 2024-07-14 ENCOUNTER — Ambulatory Visit (INDEPENDENT_AMBULATORY_CARE_PROVIDER_SITE_OTHER): Admitting: Family Medicine

## 2024-07-14 ENCOUNTER — Encounter: Payer: Self-pay | Admitting: Family Medicine

## 2024-07-14 ENCOUNTER — Ambulatory Visit: Payer: Self-pay | Admitting: Family Medicine

## 2024-07-14 VITALS — BP 124/78 | HR 76 | Temp 97.9°F | Ht 68.0 in | Wt 298.4 lb

## 2024-07-14 DIAGNOSIS — D5919 Other autoimmune hemolytic anemia: Secondary | ICD-10-CM

## 2024-07-14 DIAGNOSIS — E1142 Type 2 diabetes mellitus with diabetic polyneuropathy: Secondary | ICD-10-CM

## 2024-07-14 DIAGNOSIS — Z6841 Body Mass Index (BMI) 40.0 and over, adult: Secondary | ICD-10-CM

## 2024-07-14 DIAGNOSIS — I10 Essential (primary) hypertension: Secondary | ICD-10-CM

## 2024-07-14 DIAGNOSIS — Z7984 Long term (current) use of oral hypoglycemic drugs: Secondary | ICD-10-CM

## 2024-07-14 DIAGNOSIS — Z23 Encounter for immunization: Secondary | ICD-10-CM

## 2024-07-14 LAB — HEMOGLOBIN A1C: Hgb A1c MFr Bld: 6.5 % (ref 4.6–6.5)

## 2024-07-14 LAB — GLUCOSE, RANDOM: Glucose, Bld: 110 mg/dL — ABNORMAL HIGH (ref 70–99)

## 2024-07-14 NOTE — Assessment & Plan Note (Addendum)
 Blood pressure is at goal. Continue lisinopril -HCTZ (Zestoretic ) 20-25 mg daily.

## 2024-07-14 NOTE — Assessment & Plan Note (Signed)
 Follows with Dr. Federico (hematology). Anemia has been stable.

## 2024-07-14 NOTE — Assessment & Plan Note (Signed)
 Weight has increased some over the last year. Connie Mosley husband has experienced several significant health issues. She feels things are stabilizing and she can focus more on her own issues now.

## 2024-07-14 NOTE — Progress Notes (Signed)
 [facility] [address] [place] [address] Dept: [phone] Dept Fax: [phone]  Office Visit  Subjective:    Patient ID: Connie Mosley, female    DOB: 12-11-1960, 63 y.o..   MRN: [id number]  Chief Complaint  Patient presents with   Hypertension    3 month f/u HTN.  No fasting today.   No concerns.    History of Present Illness:  Patient is in today for reassessment of chronic medical issues.   Ms. Wadle has essential hypertension. She is managed on lisinopril -HCTZ (Zestoretic ) 20-25 mg daily.    Ms. Watters has Type 2 diabetes. She is managed on metformin  500 mg daily.   Ms. Lansdale has a history of idiopathic autoimmune hemolytic anemia. She has had a prior splenectomy. She is managed on Vitamin B12 and folate and her anemia has been stable.  Past Medical History: Patient Active Problem List   Diagnosis Date Noted   Medical contraindication to statin therapy 08/16/2023   Type 2 diabetes mellitus with morbid obesity (HCC) 08/07/2023   Vitamin D  deficiency 08/07/2023   BMI 40.0-44.9, adult (HCC) Current BMI 43.9 08/07/2023   Acute cystitis without hematuria 05/16/2023   Type 2 diabetes mellitus with diabetic polyneuropathy, without long-term current use of insulin  (HCC) 03/27/2023   Diabetic polyneuropathy (HCC) 02/13/2023   Hammer toes, bilateral 02/13/2023   PVCs (premature ventricular contractions) 01/17/2022   GERD (gastroesophageal reflux disease) 01/17/2022   Unilateral primary osteoarthritis, right knee 02/14/2021   Insulin  resistance 01/05/2020   History of non anemic vitamin B12 deficiency 04/22/2018   Morbid obesity (HCC) 06/26/2017   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   Irritable bowel syndrome 05/12/2012   Essential hypertension 09/11/2008   Idiopathic autoimmune hemolytic anemia (HCC) 2010   Past Surgical History:  Procedure Laterality Date   APPENDECTOMY  1982   JOINT REPLACEMENT  01/18/2010    l knee replacemnet   SPLENECTOMY  05/2012   TONSILECTOMY/ADENOIDECTOMY WITH MYRINGOTOMY     childhood    TOTAL KNEE ARTHROPLASTY Left 2012   Family History  Adopted: Yes  Problem Relation Age of Onset   Breast cancer Neg Hx    Outpatient Medications Prior to Visit  Medication Sig Dispense Refill   cyanocobalamin  (VITAMIN B12) 1000 MCG/ML injection INJECT 1 ML( 1,000 MCG TOTAL) INTO THE MUSCLE EVERY 14 DAYS 6 mL 0   diclofenac  (VOLTAREN ) 75 MG EC tablet Take 1 tablet (75 mg total) by mouth 2 (two) times daily. 60 tablet 3   doxycycline  (VIBRA -TABS) 100 MG tablet Take 1 tablet (100 mg total) by mouth 2 (two) times daily. 20 tablet 0   famotidine  (PEPCID ) 40 MG tablet Take 1 tablet (40 mg total) by mouth at bedtime. 90 tablet 1   folic acid  (FOLVITE ) 1 MG tablet TAKE 1 TABLET BY MOUTH DAILY 90 tablet 0   lisinopril -hydrochlorothiazide  (ZESTORETIC ) 20-25 MG tablet Take 1 tablet by mouth daily. 90 tablet 3   metFORMIN  (GLUCOPHAGE ) 500 MG tablet Take 1 tablet (500 mg total) by mouth daily with breakfast. 90 tablet 3   Syringe/Needle, Disp, (SYRINGE 3CC/20GX1) 20G X 1 3 ML MISC 1 mL by Does not apply route once a week. 24 each 1   Vitamin D , Ergocalciferol , (DRISDOL ) 1.25 MG (50000 UNIT) CAPS capsule Take 1 capsule (50,000 Units total) by mouth every 7 (seven) days. 12 capsule 0   No facility-administered medications prior to visit.   Allergies  Allergen Reactions   Cefuroxime Axetil Itching   Sulfamethoxazole-Trimethoprim Other (  See Comments)    Reacts with Bone Marrow per Hemotologist    Cefuroxime Hives and Rash     Objective:   Today's Vitals   07/14/24 0804  BP: 124/78  Pulse: 76  Temp: 97.9 F (36.6 C)  TempSrc: Temporal  SpO2: 98%  Weight: 298 lb 6.4 oz (135.4 kg)  Height: 5' 8 (1.727 m)   Body mass index is 45.37 kg/m.   General: Well developed, well nourished. No acute distress. Psych: Alert and oriented. Normal mood and affect.  Health Maintenance Due  Topic  Date Due   HIV Screening  Never done   DEXA SCAN  Never done   Cervical Cancer Screening (HPV/Pap Cotest)  01/28/2024   Influenza Vaccine  04/11/2024   COVID-19 Vaccine (4 - 2025-26 season) 05/12/2024     Lab Results:    Latest Ref Rng & Units 02/06/2024    8:25 AM 05/04/2023   11:50 AM 02/02/2023    9:36 AM  CBC  WBC 4.0 - 10.5 K/uL 9.8  10.9  9.2   Hemoglobin 12.0 - 15.0 g/dL 84.9  85.7  85.1   Hematocrit 36.0 - 46.0 % 45.1  42.1  44.5   Platelets 150 - 400 K/uL 370  320  310    Assessment & Plan:   Problem List Items Addressed This Visit       Cardiovascular and Mediastinum   Essential hypertension - Primary   Blood pressure is at goal. Continue lisinopril -HCTZ (Zestoretic ) 20-25 mg daily.          Endocrine   Type 2 diabetes mellitus with diabetic polyneuropathy, without long-term current use of insulin  (HCC)   A1c has been at goal. 03/06/2024. I will reassess this today. Continue metformin  500 mg daily.      Relevant Orders   Glucose, random   Hemoglobin A1c     Other   Idiopathic autoimmune hemolytic anemia (HCC)   Follows with Dr. Federico (hematology). Anemia has been stable.      Morbid obesity with BMI of 45.0-49.9, adult (HCC)   Weight has increased some over the last year. Ms. Bays husband has experienced several significant health issues. She feels things are stabilizing and she can focus more on her own issues now.      Other Visit Diagnoses       Need for immunization against influenza       Relevant Orders   Flu vaccine HIGH DOSE PF(Fluzone Trivalent)       Return in about 3 months (around 10/14/2024).   Garnette CHRISTELLA Simpler, MD  I,Emily Lagle,acting as a scribe for Garnette CHRISTELLA Simpler, MD.,have documented all relevant documentation on the behalf of Garnette CHRISTELLA Simpler, MD.  I, Garnette CHRISTELLA Simpler, MD, have reviewed all documentation for this visit. The documentation on 07/14/2024 for the exam, diagnosis, procedures, and orders are all accurate and complete.

## 2024-07-14 NOTE — Assessment & Plan Note (Signed)
 Continue focus on blood sugar control. Prior surgery for hammer toes on the right. Patient does not desire to seek surgery for the same issue on the left.

## 2024-07-14 NOTE — Assessment & Plan Note (Addendum)
 A1c has been at goal. 03/06/2024. I will reassess this today. Continue metformin  500 mg daily.

## 2024-07-21 ENCOUNTER — Other Ambulatory Visit

## 2024-08-05 ENCOUNTER — Other Ambulatory Visit: Payer: Self-pay | Admitting: Family Medicine

## 2024-08-05 ENCOUNTER — Other Ambulatory Visit: Payer: Self-pay | Admitting: Hematology and Oncology

## 2024-08-05 DIAGNOSIS — E88819 Insulin resistance, unspecified: Secondary | ICD-10-CM

## 2024-08-05 DIAGNOSIS — D5919 Other autoimmune hemolytic anemia: Secondary | ICD-10-CM

## 2024-08-05 DIAGNOSIS — M1711 Unilateral primary osteoarthritis, right knee: Secondary | ICD-10-CM

## 2024-08-05 DIAGNOSIS — I10 Essential (primary) hypertension: Secondary | ICD-10-CM

## 2024-08-05 NOTE — Telephone Encounter (Signed)
 Per MD OV note 02/06/24: Continue folic acid  1 mg p.o. daily

## 2024-08-12 ENCOUNTER — Ambulatory Visit (HOSPITAL_BASED_OUTPATIENT_CLINIC_OR_DEPARTMENT_OTHER)
Admission: RE | Admit: 2024-08-12 | Discharge: 2024-08-12 | Disposition: A | Discharge status: Home / Self Care | Source: Ambulatory Visit | Attending: Family Medicine | Admitting: Family Medicine

## 2024-08-12 ENCOUNTER — Ambulatory Visit: Payer: Self-pay | Admitting: Family Medicine

## 2024-08-12 DIAGNOSIS — Z13820 Encounter for screening for osteoporosis: Secondary | ICD-10-CM | POA: Diagnosis not present

## 2024-08-12 DIAGNOSIS — Z780 Asymptomatic menopausal state: Secondary | ICD-10-CM | POA: Diagnosis not present

## 2024-09-23 ENCOUNTER — Encounter: Payer: Self-pay | Admitting: Hematology and Oncology

## 2024-09-23 ENCOUNTER — Other Ambulatory Visit: Payer: Self-pay | Admitting: *Deleted

## 2024-09-23 DIAGNOSIS — D5919 Other autoimmune hemolytic anemia: Secondary | ICD-10-CM

## 2024-09-23 MED ORDER — CYANOCOBALAMIN 1000 MCG/ML IJ SOLN: 1000.0000 ug | INTRAMUSCULAR | 0 refills | Status: AC

## 2024-10-14 ENCOUNTER — Ambulatory Visit: Admitting: Family Medicine

## 2024-10-15 ENCOUNTER — Ambulatory Visit: Payer: Self-pay | Admitting: Family Medicine

## 2024-10-15 ENCOUNTER — Encounter: Payer: Self-pay | Admitting: Family Medicine

## 2024-10-15 ENCOUNTER — Ambulatory Visit: Admitting: Family Medicine

## 2024-10-15 VITALS — BP 124/68 | HR 58 | Temp 97.3°F | Ht 68.0 in | Wt 302.8 lb

## 2024-10-15 DIAGNOSIS — D5919 Other autoimmune hemolytic anemia: Secondary | ICD-10-CM

## 2024-10-15 DIAGNOSIS — K219 Gastro-esophageal reflux disease without esophagitis: Secondary | ICD-10-CM

## 2024-10-15 DIAGNOSIS — E1142 Type 2 diabetes mellitus with diabetic polyneuropathy: Secondary | ICD-10-CM

## 2024-10-15 DIAGNOSIS — Z7984 Long term (current) use of oral hypoglycemic drugs: Secondary | ICD-10-CM

## 2024-10-15 DIAGNOSIS — I10 Essential (primary) hypertension: Secondary | ICD-10-CM

## 2024-10-15 LAB — GLUCOSE, RANDOM: Glucose, Bld: 216 mg/dL — ABNORMAL HIGH (ref 70–99)

## 2024-10-15 LAB — HEMOGLOBIN A1C: Hgb A1c MFr Bld: 6.7 % — ABNORMAL HIGH (ref 4.6–6.5)

## 2024-10-15 MED ORDER — FAMOTIDINE 40 MG PO TABS: 40.0000 mg | ORAL_TABLET | Freq: Every day | ORAL | 3 refills | Status: AC

## 2024-10-15 MED ORDER — TIRZEPATIDE 2.5 MG/0.5ML ~~LOC~~ SOAJ: 2.5000 mg | SUBCUTANEOUS | 0 refills | Status: DC

## 2024-10-15 MED ORDER — TIRZEPATIDE 5 MG/0.5ML ~~LOC~~ SOAJ: 5.0000 mg | SUBCUTANEOUS | 1 refills | Status: DC

## 2024-10-15 NOTE — Assessment & Plan Note (Signed)
 Blood pressure is at goal. Continue lisinopril -HCTZ (Zestoretic ) 20-25 mg daily.

## 2024-10-15 NOTE — Assessment & Plan Note (Addendum)
 A1c has been at goal. I will reassess this today. Continue metformin  500 mg daily. Discussed that if A1c is above 7%, would consider starting Mounjaro .

## 2024-10-15 NOTE — Assessment & Plan Note (Signed)
 Follows with Dr. Federico (hematology). Anemia has been stable.

## 2024-10-15 NOTE — Progress Notes (Signed)
 " [facility] [address] [place] [address] Dept: [phone] Dept Fax: [phone]  Chronic Care Office Visit  Subjective:    Patient ID: Connie Mosley, female    DOB: 1960/10/17, 64 y.o..   MRN: [id number]  Chief Complaint  Patient presents with   Hypertension    3 month f/u HTN. No concerns.    History of Present Illness:  Patient is in today for reassessment of chronic medical conditions.   [name] has essential hypertension. She is managed on lisinopril -HCTZ (Zestoretic ) 20-25 mg daily.    [name] has Type 2 diabetes. She is managed on metformin  500 mg daily.    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 She is managed on Vitamin B12 and folate and her anemia has been stable.  [name] notes that she has fallen off on regular exercise and adhering to a good diet. Her husband has had some significant health concerns over the past 6-7 months, which has been their focus.  Past Medical History: Patient Active Problem List   Diagnosis Date Noted   Medical contraindication to statin therapy 08/16/2023   Vitamin D  deficiency 08/07/2023   Acute cystitis without hematuria 05/16/2023   Type 2 diabetes mellitus with diabetic polyneuropathy, without long-term current use of insulin  (HCC) 03/27/2023   Diabetic polyneuropathy (HCC) 02/13/2023   Hammer toes, bilateral 02/13/2023   PVCs (premature ventricular contractions) 01/17/2022   GERD (gastroesophageal reflux disease) 01/17/2022   Unilateral primary osteoarthritis, right knee 02/14/2021   Insulin  resistance 01/05/2020   History of non anemic vitamin B12 deficiency 04/22/2018   Morbid obesity with BMI of 45.0-49.9, adult (HCC) 06/26/2017   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   Irritable bowel syndrome 05/12/2012   Essential hypertension 09/11/2008   Idiopathic autoimmune hemolytic anemia (HCC) 2010   Past  Surgical History:  Procedure Laterality Date   APPENDECTOMY  05/31/1982   JOINT REPLACEMENT  01/18/2010   l knee replacemnet   SPLENECTOMY  05/2012   TONSILECTOMY/ADENOIDECTOMY WITH MYRINGOTOMY     childhood    TOTAL KNEE ARTHROPLASTY Left 2012   Family History  Adopted: Yes  Problem Relation Age of Onset   Breast cancer Neg Hx    Outpatient Medications Prior to Visit  Medication Sig Dispense Refill   cyanocobalamin  (VITAMIN B12) 1000 MCG/ML injection Inject 1 mL (1,000 mcg total) into the muscle every 14 (fourteen) days. 6 mL 0   diclofenac  (VOLTAREN ) 75 MG EC tablet TAKE 1 TABLET(75 MG TOTAL) BY MOUTH 2(TWO) TIMES DAILY 60 tablet 3   famotidine  (PEPCID ) 40 MG tablet Take 1 tablet (40 mg total) by mouth at bedtime. 90 tablet 1   folic acid  (FOLVITE ) 1 MG tablet TAKE 1 TABLET BY MOUTH DAILY 90 tablet 0   lisinopril -hydrochlorothiazide  (ZESTORETIC ) 20-25 MG tablet TAKE 1 TABLET BY MOUTH DAILY 90 tablet 3   metFORMIN  (GLUCOPHAGE ) 500 MG tablet TAKE 1 TABLET BY MOUTH DAILY WITH BREAKFAST 90 tablet 3   Syringe/Needle, Disp, (SYRINGE 3CC/20GX1) 20G X 1 3 ML MISC 1 mL by Does not apply route once a week. 24 each 1   Vitamin D , Ergocalciferol , (DRISDOL ) 1.25 MG (50000 UNIT) CAPS capsule Take 1 capsule (50,000 Units total) by mouth every 7 (seven) days. 12 capsule 0   No facility-administered medications prior to visit.   Allergies[1]   Objective:   Today's Vitals   10/15/24 1048  BP: 124/68  Pulse: (!) 58  Temp: ROLLEN)  97.3 F (36.3 C)  TempSrc: Temporal  SpO2: 96%  Weight: (!) 302 lb 12.8 oz (137.3 kg)  Height: 5' 8 (1.727 m)   Body mass index is 46.04 kg/m.   General: Well developed, well nourished. No acute distress. Psych: Alert and oriented. Normal mood and affect.  Health Maintenance Due  Topic Date Due   COVID-19 Vaccine (4 - 2025-26 season) 05/12/2024   OPHTHALMOLOGY EXAM  08/15/2024    Imaging DG Bone Density Result Date: 08/12/2024 IMPRESSION: Normal based  on BMD. Fracture risk is not increased.   Lab Results Lab Results  Component Value Date   HGBA1C 6.5 07/14/2024   HGBA1C 5.7 (H) 02/06/2024   HGBA1C 6.3 08/16/2023      Latest Ref Rng & Units 02/06/2024    8:25 AM 05/04/2023   11:50 AM 02/02/2023    9:36 AM  CBC  WBC 4.0 - 10.5 K/uL 9.8  10.9  9.2   Hemoglobin 12.0 - 15.0 g/dL 84.9  85.7  85.1   Hematocrit 36.0 - 46.0 % 45.1  42.1  44.5   Platelets 150 - 400 K/uL 370  320  310    Assessment & Plan:   Problem List Items Addressed This Visit       Cardiovascular and Mediastinum   Essential hypertension - Primary   Blood pressure is at goal. Continue lisinopril -HCTZ (Zestoretic ) 20-25 mg daily.          Digestive   GERD (gastroesophageal reflux disease)   Stable. I will renew her famotidine  40 mg bid.      Relevant Medications   famotidine  (PEPCID ) 40 MG tablet     Endocrine   Type 2 diabetes mellitus with diabetic polyneuropathy, without long-term current use of insulin  (HCC)   A1c has been at goal. I will reassess this today. Continue metformin  500 mg daily. Discussed that if A1c is above 7%, would consider starting Mounjaro .      Relevant Orders   Glucose, random   Hemoglobin A1c     Other   Idiopathic autoimmune hemolytic anemia (HCC)   Follows with Dr. Federico (hematology). Anemia has been stable.      Other Visit Diagnoses       Long term current use of oral hypoglycemic drug           Return in about 3 months (around 01/12/2025) for Reassessment.   Garnette CHRISTELLA Simpler, MD  I,Emily Lagle,acting as a scribe for Garnette CHRISTELLA Simpler, MD.,have documented all relevant documentation on the behalf of Garnette CHRISTELLA Simpler, MD.  I, Garnette CHRISTELLA Simpler, MD, have reviewed all documentation for this visit. The documentation on 10/15/2024 for the exam, diagnosis, procedures, and orders are all accurate and complete.      [1]  Allergies Allergen Reactions   Cefuroxime Axetil Itching   Sulfamethoxazole-Trimethoprim Other (See  Comments)    Reacts with Bone Marrow per Hemotologist    Cefuroxime Hives and Rash   "

## 2024-10-15 NOTE — Assessment & Plan Note (Signed)
 Stable. I will renew her famotidine  40 mg bid.

## 2024-10-16 MED ORDER — OZEMPIC (0.25 OR 0.5 MG/DOSE) 2 MG/3ML ~~LOC~~ SOPN: PEN_INJECTOR | SUBCUTANEOUS | 2 refills | Status: AC

## 2024-10-16 NOTE — Assessment & Plan Note (Signed)
 Continue focus on blood sugar control. Prior surgery for hammer toes on the right. Patient does not desire to seek surgery for the same issue on the left.

## 2024-10-16 NOTE — Addendum Note (Signed)
 Addended by: THEDORA GARNETTE HERO on: 10/16/2024 10:57 AM   Modules accepted: Orders

## 2024-10-17 ENCOUNTER — Telehealth: Payer: Self-pay

## 2024-10-17 NOTE — Progress Notes (Signed)
 Complex Care Management Note  Care Guide Note 10/17/2024 Name: Connie Mosley MRN: [id number] DOB: 1960/09/19  Connie Mosley is a 64 y.o. year old female who sees Rudd, Garnette HERO, MD for primary care. I reached out to [facility] by phone today to offer complex care management services.  [name] was given information about Complex Care Management services today including:   The Complex Care Management services include support from the care team which includes your Nurse Care Manager, Clinical Social Worker, or Pharmacist.  The Complex Care Management team is here to help remove barriers to the health concerns and goals most important to you. Complex Care Management services are voluntary, and the patient may decline or stop services at any time by request to their care team member.   Complex Care Management Consent Status: Patient agreed to services and verbal consent obtained.   Follow up plan:  Telephone appointment with complex care management team member scheduled for:  10/24/24 at 3:30 p.m.   Encounter Outcome:  Patient Scheduled  Dreama [facility]  [facility], [facility] VBCI Assistant Direct Dial: [phone]  Fax: [phone]

## 2024-10-24 ENCOUNTER — Other Ambulatory Visit

## 2025-01-12 ENCOUNTER — Ambulatory Visit: Admitting: Family Medicine

## 2025-01-28 ENCOUNTER — Inpatient Hospital Stay: Admitting: Hematology and Oncology

## 2025-01-28 ENCOUNTER — Inpatient Hospital Stay

## 2025-03-13 ENCOUNTER — Ambulatory Visit

## 2025-03-24 ENCOUNTER — Ambulatory Visit
